# Patient Record
Sex: Male | Born: 1950 | Race: Black or African American | Hispanic: No | Marital: Single | Smoking: Current some day smoker
Health system: Southern US, Community
[De-identification: ages and names within clinical notes are randomized; demographics above are authoritative.]

## PROBLEM LIST (undated history)

## (undated) DIAGNOSIS — M5440 Lumbago with sciatica, unspecified side: Secondary | ICD-10-CM

## (undated) DIAGNOSIS — I10 Essential (primary) hypertension: Secondary | ICD-10-CM

## (undated) DIAGNOSIS — M545 Low back pain, unspecified: Secondary | ICD-10-CM

## (undated) DIAGNOSIS — E860 Dehydration: Secondary | ICD-10-CM

## (undated) DIAGNOSIS — Z8546 Personal history of malignant neoplasm of prostate: Secondary | ICD-10-CM

## (undated) DIAGNOSIS — N182 Chronic kidney disease, stage 2 (mild): Secondary | ICD-10-CM

## (undated) HISTORY — DX: Personal history of malignant neoplasm of prostate: Z85.46

## (undated) HISTORY — DX: Essential (primary) hypertension: I10

## (undated) HISTORY — DX: Dehydration: E86.0

## (undated) HISTORY — PX: PROSTATE SURGERY: SHX751

## (undated) HISTORY — DX: Low back pain, unspecified: M54.50

## (undated) HISTORY — PX: COLONOSCOPY: SHX174

## (undated) HISTORY — DX: Lumbago with sciatica, unspecified side: M54.40

## (undated) HISTORY — DX: Chronic kidney disease, stage 2 (mild): N18.2

---

## 2001-08-15 ENCOUNTER — Ambulatory Visit (HOSPITAL_COMMUNITY): Admission: RE | Admit: 2001-08-15 | Discharge: 2001-08-15 | Payer: Self-pay | Admitting: Internal Medicine

## 2003-08-06 ENCOUNTER — Encounter (INDEPENDENT_AMBULATORY_CARE_PROVIDER_SITE_OTHER): Payer: Self-pay | Admitting: Specialist

## 2003-08-07 ENCOUNTER — Inpatient Hospital Stay (HOSPITAL_COMMUNITY): Admission: RE | Admit: 2003-08-07 | Discharge: 2003-08-08 | Payer: Self-pay | Admitting: Urology

## 2004-09-26 ENCOUNTER — Ambulatory Visit (HOSPITAL_COMMUNITY): Admission: RE | Admit: 2004-09-26 | Discharge: 2004-09-26 | Payer: Self-pay | Admitting: Emergency Medicine

## 2005-04-27 ENCOUNTER — Encounter: Admission: RE | Admit: 2005-04-27 | Discharge: 2005-04-27 | Payer: Self-pay | Admitting: Emergency Medicine

## 2005-06-12 ENCOUNTER — Encounter: Admission: RE | Admit: 2005-06-12 | Discharge: 2005-06-12 | Payer: Self-pay | Admitting: Orthopaedic Surgery

## 2005-06-26 ENCOUNTER — Encounter: Admission: RE | Admit: 2005-06-26 | Discharge: 2005-06-26 | Payer: Self-pay | Admitting: Orthopaedic Surgery

## 2005-10-20 ENCOUNTER — Ambulatory Visit: Payer: Self-pay | Admitting: Internal Medicine

## 2005-10-22 ENCOUNTER — Ambulatory Visit: Payer: Self-pay | Admitting: Internal Medicine

## 2005-10-22 ENCOUNTER — Ambulatory Visit (HOSPITAL_COMMUNITY): Admission: RE | Admit: 2005-10-22 | Discharge: 2005-10-22 | Payer: Self-pay | Admitting: Internal Medicine

## 2005-11-04 ENCOUNTER — Ambulatory Visit: Payer: Self-pay

## 2006-07-13 ENCOUNTER — Emergency Department (HOSPITAL_COMMUNITY): Admission: EM | Admit: 2006-07-13 | Discharge: 2006-07-14 | Payer: Self-pay | Admitting: Emergency Medicine

## 2007-09-07 ENCOUNTER — Ambulatory Visit: Payer: Self-pay | Admitting: Internal Medicine

## 2007-09-08 ENCOUNTER — Ambulatory Visit: Payer: Self-pay | Admitting: *Deleted

## 2007-10-19 ENCOUNTER — Ambulatory Visit: Payer: Self-pay | Admitting: Internal Medicine

## 2007-10-19 ENCOUNTER — Encounter: Payer: Self-pay | Admitting: Family Medicine

## 2007-10-19 LAB — CONVERTED CEMR LAB
ALT: 13 units/L (ref 0–53)
AST: 16 units/L (ref 0–37)
Alkaline Phosphatase: 47 units/L (ref 39–117)
Basophils Absolute: 0 10*3/uL (ref 0.0–0.1)
Calcium: 10.4 mg/dL (ref 8.4–10.5)
Chloride: 102 meq/L (ref 96–112)
Cholesterol: 157 mg/dL (ref 0–200)
HCT: 44.8 % (ref 39.0–52.0)
HDL: 44 mg/dL (ref >39)
Hemoglobin: 15.1 g/dL (ref 13.0–17.0)
LDL Cholesterol: 95 mg/dL (ref 0–99)
Lymphs Abs: 2.4 10*3/uL (ref 0.7–4.0)
Neutro Abs: 2.3 10*3/uL (ref 1.7–7.7)
Platelets: 253 10*3/uL (ref 150–400)
Sed Rate: 9 mm/hr (ref 0–16)
Sodium: 142 meq/L (ref 135–145)
Total CHOL/HDL Ratio: 3.6
Triglycerides: 90 mg/dL (ref <150)
WBC: 5.9 10*3/uL (ref 4.0–10.5)

## 2009-03-08 ENCOUNTER — Ambulatory Visit: Payer: Self-pay | Admitting: Internal Medicine

## 2009-07-09 ENCOUNTER — Emergency Department (HOSPITAL_COMMUNITY): Admission: EM | Admit: 2009-07-09 | Discharge: 2009-07-09 | Payer: Self-pay | Admitting: Family Medicine

## 2009-09-13 ENCOUNTER — Ambulatory Visit: Payer: Self-pay | Admitting: Internal Medicine

## 2009-09-13 LAB — CONVERTED CEMR LAB: Microalb, Ur: 1.06 mg/dL (ref 0.00–1.89)

## 2010-05-06 ENCOUNTER — Encounter (INDEPENDENT_AMBULATORY_CARE_PROVIDER_SITE_OTHER): Payer: Self-pay | Admitting: Family Medicine

## 2010-05-06 LAB — CONVERTED CEMR LAB
ALT: 13 units/L (ref 0–53)
AST: 17 units/L (ref 0–37)
Calcium: 10.4 mg/dL (ref 8.4–10.5)
Chloride: 101 meq/L (ref 96–112)
Cholesterol: 161 mg/dL (ref 0–200)
Creatinine, Ser: 1.39 mg/dL (ref 0.40–1.50)
HDL: 32 mg/dL — ABNORMAL LOW (ref >39)
Potassium: 5.1 meq/L (ref 3.5–5.3)
Sodium: 139 meq/L (ref 135–145)
Total CHOL/HDL Ratio: 5
Total Protein: 8.4 g/dL — ABNORMAL HIGH (ref 6.0–8.3)

## 2010-05-11 ENCOUNTER — Encounter: Payer: Self-pay | Admitting: Emergency Medicine

## 2010-08-15 ENCOUNTER — Other Ambulatory Visit: Payer: Self-pay | Admitting: Family Medicine

## 2010-08-15 ENCOUNTER — Ambulatory Visit (HOSPITAL_COMMUNITY)
Admission: RE | Admit: 2010-08-15 | Discharge: 2010-08-15 | Disposition: A | Discharge status: Home / Self Care | Payer: Self-pay | Source: Ambulatory Visit | Attending: Family Medicine | Admitting: Family Medicine

## 2010-08-15 DIAGNOSIS — M51379 Other intervertebral disc degeneration, lumbosacral region without mention of lumbar back pain or lower extremity pain: Secondary | ICD-10-CM | POA: Insufficient documentation

## 2010-08-15 DIAGNOSIS — M545 Low back pain, unspecified: Secondary | ICD-10-CM | POA: Insufficient documentation

## 2010-08-15 DIAGNOSIS — M5030 Other cervical disc degeneration, unspecified cervical region: Secondary | ICD-10-CM | POA: Insufficient documentation

## 2010-08-15 DIAGNOSIS — M5137 Other intervertebral disc degeneration, lumbosacral region: Secondary | ICD-10-CM | POA: Insufficient documentation

## 2010-08-15 DIAGNOSIS — R52 Pain, unspecified: Secondary | ICD-10-CM

## 2010-09-05 NOTE — Discharge Summary (Signed)
[name], [name]                         ACCOUNT NO.:  [id number]   MEDICAL RECORD NO.:  [id number]                   PATIENT TYPE:  INP   LOCATION:  0375                                 FACILITY:  [facility]   PHYSICIAN:  Lindaann Slough, M.D.               DATE OF BIRTH:  11/06/1950   DATE OF ADMISSION:  08/06/2003  DATE OF DISCHARGE:  08/08/2003                                 DISCHARGE SUMMARY   DISCHARGE DIAGNOSES:  1. Benign prostatic hypertrophy.  2. Bladder outlet obstruction.   PROCEDURES DONE:  Cystoscopy and TURP on August 06, 2003.   HISTORY:  The patient is a 60 year old male who was admitted with complaints  of frequency, hesitancy, decreased force of urinary stream, nocturia x4 for  the past several months.  He had been having those symptoms in the past, and  was treated with Flomax, but he stopped taking the medication.  He was then  restarted on Flomax, and his symptoms had not improved.  He was then  switched to Uroxatral, and he continued to have the same symptoms.  Cystoscopy showed trilobar prostatic hypertrophy and trabeculated bladder  with cellules.  Renal ultrasound showed no hydronephrosis.  The patient was  admitted on August 06, 2003 for TURP.   PHYSICAL EXAMINATION:  VITAL SIGNS:  Blood pressure was 112/70, pulse 85,  respirations 18, temperature 98.6.  HEENT:  Head is normal.  Pupils are equal, round and reactive to light.  Ears, nose, and throat within normal limits.  NECK:  Supple.  No cervical lymph nodes.  No thyromegaly.  CHEST:  Asymmetrical.  LUNGS:  Fully expanded and clear to percussion and auscultation.  HEART:  Regular rhythm.  No murmur, no gallops.  ABDOMEN:  Soft, nondistended, nontender.  Liver, spleen, and kidney is not  palpable.  Bowel sounds normal.  GENITALIA:  Penis is uncircumcised.  The meatus is normal.  Scrotum is  normal.  Testicles, cords, and epididymides are within normal limits.  RECTAL:  Sphincter tone is normal.   Prostate is enlarged at 70 gm.  No  nodules.  Seminal vesicles not palpable.   LABORATORY DATA:  Hemoglobin on admission was 14.3, hematocrit 42.4, WBC  5.4.  Sodium 139, potassium 4.6, chloride 105, BUN 12, creatinine 1.2,  glucose 90.  Urinalysis is normal.   EKG showed normal sinus rhythm.  The patient had a TURP on August 06, 2003.  The postoperative course was uneventful.  He remained afebrile.  He was  started on a liquid diet on the first day postoperatively.  He tolerated his  diet well.  On the second day postoperatively, the Foley catheter was  removed, and after removing the Foley, he was voiding well.  His urine was  clear.  He was then discharged home on August 08, 2003.   DISCHARGE MEDICATIONS:  1. Hydrochlorothiazide 25 mg daily.  2. Bactrim DS one tablet twice a day.  DISCHARGE INSTRUCTIONS:  The patient is instructed not to do any lifting,  straining, driving, or engage in any sexual activities until further  advised.   DISCHARGE DIET:  Regular.   CONDITION ON DISCHARGE:  Improved.   FOLLOW UP:  The patient will be followed in my office in about 3 weeks.                                               Lindaann Slough, M.D.    MN/MEDQ  D:  08/08/2003  T:  08/08/2003  Job:  [id number]

## 2010-09-05 NOTE — Op Note (Signed)
Timothy [name], GASTER NO.:  [id number]   MEDICAL RECORD NO.:  [id number]          PATIENT TYPE:  OIB   LOCATION:  2899                         FACILITY:  [facility]   PHYSICIAN:  Duke Salvia, MD, FACCDATE OF BIRTH:  1951/03/21   DATE OF PROCEDURE:  DATE OF DISCHARGE:  10/22/2005                                 OPERATIVE REPORT   PROCEDURE:  Exploration of a previous implanted loop recorder.   DESCRIPTION OF PROCEDURE:  After obtaining informed consent, the patient was  brought to the electrophysiology laboratory and placed on the fluoroscopic  table.  After routine prep and drape, Lidocaine was infiltrated in line with  the previous incision.  The incision was open and carried down to the layer  of the device pocket.  Electrocautery and sharp dissection of the device was  explanted.  Anterior and posterior aspects of the pocket were apposed with a  Vicryl suture.  The pocket was then irrigated with antibiotic-containing  saline solution and the wound was closed in three layers in normal fashion.  The wound was washed and dried and benzo and Steri-Strip was then applied.  Needle and instrument counts were correct at the end of the procedure  according to staff.  The patient tolerated the procedure without apparent  complications.           ______________________________  Duke Salvia, MD, [facility]     SCK/MEDQ  D:  02/11/2006  T:  02/11/2006  Job:  [id number]

## 2010-09-05 NOTE — H&P (Signed)
NAME:  Timothy [name], Timothy [name]                         ACCOUNT NO.:  [id number]   MEDICAL RECORD NO.:  [id number]                   PATIENT TYPE:  OBV   LOCATION:  0375                                 FACILITY:  [facility]   PHYSICIAN:  Lindaann Slough, M.D.               DATE OF BIRTH:  1950-08-31   DATE OF ADMISSION:  08/06/2003  DATE OF DISCHARGE:                                HISTORY & PHYSICAL   CHIEF COMPLAINT:  Frequency, hesitancy, decreased force of urinary stream,  and nocturia times 4 to 5.   HISTORY OF PRESENT ILLNESS:  The patient is a 60 year old male who has been  complaining of frequency, hesitancy, decrease in force of urinary stream,  nocturia times 4-5 for the past several months.  He had been on Flomax for  these symptoms before, but he stopped taking that.  He was then restarted on  Flomax, and his symptoms have not abated.  He was then switched to Uroxatral  and continued to have the same symptoms.  Endoscopy showed a trilobar  hypertrophy and chronic trabeculectomy with cellules.  Renal ultrasound  showed no hydronephrosis.  The patient had continued to complain of these  symptoms.  Other options were discussed with him.  TUNA procedure, TURP.  He  states that he would like to proceed with TURP, so he is admitted today for  the procedure.   PAST MEDICAL HISTORY:  History of hypertension.   MEDICATIONS:  Hydrochlorothiazide 25 mg daily.   ALLERGIES:  No known drug allergies.   PAST SURGICAL HISTORY:  Loop recorder implantation in 2001 for syncope.   SOCIAL HISTORY:  He is married, has eight children.  He smokes cigars, does  not drink.   FAMILY HISTORY:  He does not know anything about his father.  His mother is  69 years old and is doing well.  There is a family history of hypertension,  and he has four sisters and two brothers.   REVIEW OF SYSTEMS:  He has no cough, no shortness of breath, no chest pain,  he has tachycardia.  He has no nausea, no vomiting, no  diarrhea or  constipation.  GENITOURINARY:  He complains of frequency, urgency,  hesitancy, decreased in force of urinary stream, and nocturia x4.   PHYSICAL EXAMINATION:  GENERAL:  This is a well-developed 60 year old male  in no acute distress.  VITAL SIGNS:  His blood pressure is 112/70, pulse 85, respirations 18,  temperature 98.6.  HEENT:  His head is normal.  Pupils equal, round, reactive to light and  accommodation.  Ears, nose, and throat are within normal limits.  NECK:  Supple, no cervical lymph nodes, no thyromegaly.  CHEST:  Symmetrical.  LUNGS:  Fully expanded and clear to auscultation and percussion.  HEART:  Regular rhythm, no murmurs, no gallops.  ABDOMEN:  Soft, nontender, nondistended.  Liver, spleen and kidneys not  palpable.  Bowel sounds normal.  GENITOURINARY:  Penis is uncircumcised and meatus is normal.  Scrotum is  unremarkable.  He has no hydrocele.  Testicles, cords, and epididymis are  within normal limits.  EXTREMITIES:  Within normal limits.  He has no pedal edema.  No deformities,  and good peripheral pulses.  RECTAL:  Sphincter tone is normal.  Prostate is enlarged, 40 g, no nodules,  seminal vesicles not palpable.   ADMISSION DIAGNOSES:  1. Bladder outlet obstruction.  2. Benign prostatic hypertrophy.                                               Lindaann Slough, M.D.    MN/MEDQ  D:  08/06/2003  T:  08/06/2003  Job:  [id number]

## 2010-09-05 NOTE — Op Note (Signed)
NAMEROLANDO, HESSLING NO.:  [id number]   MEDICAL RECORD NO.:  [id number]          PATIENT TYPE:  OIB   LOCATION:  2899                         FACILITY:  [facility]   PHYSICIAN:  Duke Salvia, MD, FACCDATE OF BIRTH:  10-31-1950   DATE OF PROCEDURE:  10/22/2005  DATE OF DISCHARGE:  10/22/2005                                 OPERATIVE REPORT   SURGEON:  Duke Salvia, MD, [facility].   PROCEDURE:  Explantation of a loop recorder.   PREOPERATIVE DIAGNOSIS:  Syncope, with previously implanted loop recorder.   POSTOPERATIVE DIAGNOSIS:  Syncope, with previously implanted loop recorder.   DESCRIPTION OF PROCEDURE:  Following obtaining informed consent, the patient  was brought to the electrophysiology laboratory and placed on the  fluoroscopic table in the supine position.  After routine prep and drape,  lidocaine was infiltrated along the line of the previous incision, and  carried down to the layer of the device pocket using electrocautery and  sharp dissection.  The pocket was opened.  The device was explanted with the  removal of the anchoring sutures.  The pocket was copiously irrigated with  antibiotic-containing saline solution.  Hemostasis was obtained.  The  anterior and posterior aspects of the pocket were opposed with a 2-0 Vicryl  suture, and the wound was then closed in 2  layers in the normal fashion.  The wound was washed and dried and a benzoin and Steri-Strips dressing was  applied.  Needle counts, sponge counts and instrument counts were correct at  the end of the procedure according to the staff.  The patient tolerated the  procedure without apparent complication.           ______________________________  Duke Salvia, MD, [facility]     SCK/MEDQ  D:  12/26/2005  T:  12/26/2005  Job:  [id number]

## 2010-09-05 NOTE — Op Note (Signed)
NAME:  Timothy [name], Timothy [name]                         ACCOUNT NO.:  [id number]   MEDICAL RECORD NO.:  [id number]                   PATIENT TYPE:  OBV   LOCATION:  0375                                 [facility]ACILITY[facility]   PHYSICIAN:  Lindaann Slough, M.D.               DATE OF BIRTH:  August 02, 1950   DATE OF PROCEDURE:  08/06/2003  DATE OF DISCHARGE:                                 OPERATIVE REPORT   PREOPERATIVE DIAGNOSIS:  Benign prostatic hypertrophy and bladder outlet  obstruction.   POSTOPERATIVE DIAGNOSIS:  Benign prostatic hypertrophy and bladder outlet  obstruction.   OPERATION/PROCEDURE:  Transurethral resection of the prostate.   SURGEON:  Lindaann Slough, M.D.   ANESTHESIA:  General.   INDICATIONS:  The patient is a 60 year old male who has been complaining of  frequency, hesitancy, decreased force of the urinary stream and nocturia x4-  5 for several months.  He had been treated with Flomax for the symptoms  before.  However, he had to stop taking the medication.  He was then  restarted on Flomax and he did not have any improvement of the symptoms.  He  was then switched to UroXatral without any improvement.  Cystoscopy showed a  trilobar prostatic hypertrophy and trabeculated bladder.  Renal ultrasound  showed no hydronephrosis.  Treatment options were discussed and we found the  patient TUNA versus TURP and he chose to have a TURP.  The risks and  benefits of the procedure were discussed with him.  The risks include but  are not limited to hemorrhage, infection, incontinence, and retrograde  ejaculation.  He understands and is agreeable.  He is scheduled today for  the procedure.   DESCRIPTION OF PROCEDURE:  Under general anesthesia, the patient was prepped  and draped and placed in the dorsal lithotomy position.  A #22 Wappler  cystoscope was inserted in the bladder.  The patient had trilobar prostatic  hypertrophy with a large median lobe.  The bladder is trabeculated  with  cellules.  The ureteral orifices are in normal position and shape and clear  efflux.  The cystoscope was then removed.  The urethra was then dilated with  #30-French.  Then a #28 continuous-flow Iglesias resectoscope was inserted  in the bladder.  The median lobe was then resected first.  Then resection of  the prostate gland was done between the 7 and 10 o'clock position, between  the 2 and 5 o'clock position using the bladder neck and verumontanum has  landmarks.  Then the resection was completed between 10 and 2 o'clock  position, and between the 5 and 7 o'clock position using the same landmarks.  Hemostasis was secured with electrocautery.  The prostatic chips were then  irrigated out of the bladder.  There was minimal  bleeding at the end of the procedure.  The resectoscope was then removed.  Then a #24, three-way Foley catheter was inserted in the bladder.  The patient tolerated the procedure well and left the OR in satisfactory  condition to post anesthesia care unit.                                               Lindaann Slough, M.D.    MN/MEDQ  D:  08/06/2003  T:  08/06/2003  Job:  [id number]   cc:   Reuben Likes, M.D.  [address]  [place]  [place] [address]  Fax: [phone]   Duke Salvia, M.D.

## 2010-09-05 NOTE — Procedures (Signed)
[place]  Patient:    Timothy [name], Timothy [name] Visit Number: [id number] MRN: [id number]          Service Type: CAT Location: [facility] 2899 13 Attending Physician:  Nathen May Dictated by:   Nathen May, M.D., [facility] Proc. Date: 08/15/01 Admit Date:  08/15/2001 Discharge Date: 08/15/2001   CC:         Reuben Likes, M.D.  Electrophysiology Laboratory  [place]   Procedure Report  PREOPERATIVE DIAGNOSIS:  Recurrent syncope.  POSTOPERATIVE DIAGNOSIS:  Recurrent syncope.  PROCEDURE:  Head-up tilt table testing with isoproterenol infusion.  DESCRIPTION OF PROCEDURE:  Following the equilibrations and in the supine position, the patient was tilted upright at 70 degrees.  There was no significant change in heart rate and/or blood pressure.  He was returned to the supine position.  Isoproterenol was then started at 1 mcg/min. and increased gradually to 2 mcg/min. to accomplish an incremental heart rate of 25%.  The patient was then tilted upright.  Again there was no significant change in heart rate or blood pressure.  IMPRESSION:  Negative head-up tilt table test.  RECOMMENDATION:  Implantable loop recorder. Dictated by:   Nathen May, M.D., [facility] Attending Physician:  Nathen May DD:  08/15/01 TD:  08/15/01 Job: [id number]

## 2010-09-05 NOTE — Procedures (Signed)
[name]  Patient:    Timothy [name], Timothy [name] Visit Number: [id number] MRN: [id number]          Service Type: CAT Location: [facility] 2899 13 Attending Physician:  Nathen May Dictated by:   Nathen May, M.D., [facility] Proc. Date: 08/15/01 Admit Date:  08/15/2001 Discharge Date: 08/15/2001   CC:         Reuben Likes, M.D.  Electrophysiology Laboratory  [place]   Procedure Report  PREOPERATIVE DIAGNOSIS:  Recurrent syncope with negative tilt table test.  POSTOPERATIVE DIAGNOSIS:  Recurrent syncope with negative tilt table test.  PROCEDURE:  Implantation of a loop recorder.  DESCRIPTION OF PROCEDURE:  Following the obtaining of informed consent, the patient was then prepared for implantable loop recorder insertion.  After routine prep and drape of the left upper chest, lidocaine was infiltrated approximately 2 cm below the clavicle and 2-3 cm lateral to the sternum just lateral to a couple of bony prominences.  Incision was carried down to the layer of the prepectoral fascia and a pocket was formed. Hemostasis was obtained.  A Reveal Plus [id number] implantable monitor was inserted, serial number [id number]. It was fixed with a single incision, looped through the lateral and the medial aspects of the recorder and tied laterally below the device.  Hemostasis was again assured, and the wound was then closed in three layers in the normal fashion.  The wound was washed, dried, and a benzoin and Steri-Strip dressing was then applied.  Needle counts, sponge counts, and instrument counts were correct at the end of the procedure according to the staff.  The patient tolerated the procedure well. Dictated by:   Nathen May, M.D., [facility] Attending Physician:  Nathen May DD:  08/15/01 TD:  08/15/01 Job: [id number]

## 2013-11-20 ENCOUNTER — Encounter: Payer: Self-pay | Admitting: Internal Medicine

## 2014-06-26 ENCOUNTER — Emergency Department (HOSPITAL_COMMUNITY)
Admission: EM | Admit: 2014-06-26 | Discharge: 2014-06-26 | Disposition: A | Discharge status: Home / Self Care | Payer: No Typology Code available for payment source | Attending: Emergency Medicine | Admitting: Emergency Medicine

## 2014-06-26 ENCOUNTER — Encounter (HOSPITAL_COMMUNITY): Payer: Self-pay | Admitting: Emergency Medicine

## 2014-06-26 DIAGNOSIS — Y92410 Unspecified street and highway as the place of occurrence of the external cause: Secondary | ICD-10-CM | POA: Insufficient documentation

## 2014-06-26 DIAGNOSIS — Z720 Tobacco use: Secondary | ICD-10-CM | POA: Diagnosis not present

## 2014-06-26 DIAGNOSIS — Y998 Other external cause status: Secondary | ICD-10-CM | POA: Diagnosis not present

## 2014-06-26 DIAGNOSIS — M545 Low back pain, unspecified: Secondary | ICD-10-CM

## 2014-06-26 DIAGNOSIS — S3992XA Unspecified injury of lower back, initial encounter: Secondary | ICD-10-CM | POA: Insufficient documentation

## 2014-06-26 DIAGNOSIS — S199XXA Unspecified injury of neck, initial encounter: Secondary | ICD-10-CM | POA: Diagnosis not present

## 2014-06-26 DIAGNOSIS — Y9389 Activity, other specified: Secondary | ICD-10-CM | POA: Diagnosis not present

## 2014-06-26 DIAGNOSIS — S0990XA Unspecified injury of head, initial encounter: Secondary | ICD-10-CM | POA: Diagnosis not present

## 2014-06-26 DIAGNOSIS — M542 Cervicalgia: Secondary | ICD-10-CM

## 2014-06-26 MED ORDER — HYDROCODONE-ACETAMINOPHEN 5-325 MG PO TABS: 2.0000 | ORAL_TABLET | ORAL | Status: DC | PRN
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
## 2014-06-26 NOTE — Discharge Instructions (Signed)

## 2017-04-26 ENCOUNTER — Ambulatory Visit (HOSPITAL_COMMUNITY)
Admission: EM | Admit: 2017-04-26 | Discharge: 2017-04-26 | Disposition: A | Discharge status: Home / Self Care | Payer: Medicare HMO | Attending: Family Medicine | Admitting: Family Medicine

## 2017-04-26 ENCOUNTER — Encounter (HOSPITAL_COMMUNITY): Payer: Self-pay | Admitting: Emergency Medicine

## 2017-04-26 DIAGNOSIS — M5441 Lumbago with sciatica, right side: Secondary | ICD-10-CM | POA: Diagnosis not present

## 2017-04-26 DIAGNOSIS — G8929 Other chronic pain: Secondary | ICD-10-CM | POA: Diagnosis not present

## 2017-04-26 MED ORDER — PREDNISONE 10 MG (21) PO TBPK: ORAL_TABLET | ORAL | 0 refills | Status: AC

## 2017-04-26 MED ORDER — KETOROLAC TROMETHAMINE 30 MG/ML IJ SOLN
INTRAMUSCULAR | Status: AC
  Filled 2017-04-26: qty 1

## 2017-04-26 MED ORDER — MELOXICAM 7.5 MG PO TABS: 7.5000 mg | ORAL_TABLET | Freq: Every day | ORAL | 0 refills | Status: DC

## 2017-04-26 MED ORDER — METHYLPREDNISOLONE SODIUM SUCC 125 MG IJ SOLR
80.0000 mg | Freq: Once | INTRAMUSCULAR | Status: AC
  Administered 2017-04-26: 80 mg via INTRAMUSCULAR

## 2017-04-26 MED ORDER — KETOROLAC TROMETHAMINE 30 MG/ML IJ SOLN
30.0000 mg | Freq: Once | INTRAMUSCULAR | Status: AC
Start: 2017-04-26 — End: 2017-04-26
  Administered 2017-04-26: 30 mg via INTRAMUSCULAR

## 2017-04-26 MED ORDER — METHYLPREDNISOLONE SODIUM SUCC 125 MG IJ SOLR
INTRAMUSCULAR | Status: AC
  Filled 2017-04-26: qty 2

## 2017-04-26 NOTE — ED Provider Notes (Signed)
[facility]    CSN: [id number] Arrival date & time: 04/26/17  1424     History   Chief Complaint Chief Complaint  Patient presents with  . Back Pain  . Leg Pain    HPI Timothy [name] is a 67 y.o. male.   67 year old male with no significant past medical history comes in for 1 week history of acute on chronic back pain.  Patient states he has been treating his chronic back pain with stretches and movement, which has not helped with his current episode of back pain.  He states in the past he had x-rays that showed degenerative disc disease.  He denies saddle anesthesia, loss of bladder or bowel control.  He has not taken anything for the pain.  Denies urinary symptoms such as frequency, dysuria, hematuria.  States that low back pain radiates down the right leg if he bends his back.      History reviewed. No pertinent past medical history.  There are no active problems to display for this patient.   Past Surgical History:  Procedure Laterality Date  . PROSTATE SURGERY         Home Medications    Prior to Admission medications   Medication Sig Start Date End Date Taking? Authorizing Provider  meloxicam (MOBIC) 7.5 MG tablet Take 1 tablet (7.5 mg total) by mouth daily. 04/26/17   Tasia Catchings, Naina Sleeper V, PA-C  predniSONE (STERAPRED UNI-PAK 21 TAB) 10 MG (21) TBPK tablet Take 6 tablets (60 mg total) by mouth daily for 2 days, THEN 5 tablets (50 mg total) daily for 2 days, THEN 4 tablets (40 mg total) daily for 2 days, THEN 3 tablets (30 mg total) daily for 2 days, THEN 2 tablets (20 mg total) daily for 2 days, THEN 1 tablet (10 mg total) daily for 2 days. 04/26/17 05/08/17  Ok Edwards, PA-C    Family History No family history on file.  Social History Social History   Tobacco Use  . Smoking status: Current Some Day Smoker    Types: Cigars  . Smokeless tobacco: Never Used  Substance Use Topics  . Alcohol use: Yes  . Drug use: Yes    Types: Marijuana     Allergies     Patient has no known allergies.   Review of Systems Review of Systems  Reason unable to perform ROS: See HPI as above.     Physical Exam Triage Vital Signs ED Triage Vitals  Enc Vitals Group     BP 04/26/17 1440 (!) 159/95     Pulse Rate 04/26/17 1440 88     Resp 04/26/17 1440 16     Temp 04/26/17 1440 98.3 F (36.8 C)     Temp Source 04/26/17 1440 Oral     SpO2 04/26/17 1440 97 %     Weight 04/26/17 1441 165 lb (74.8 kg)     Height 04/26/17 1441 5\' 6"  (1.676 m)     Head Circumference --      Peak Flow --      Pain Score 04/26/17 1441 9     Pain Loc --      Pain Edu? --      Excl. in [place]? --    No data found.  Updated Vital Signs BP (!) 159/95   Pulse 88   Temp 98.3 F (36.8 C) (Oral)   Resp 16   Ht 5\' 6"  (1.676 m)   Wt 165 lb (74.8 kg)   SpO2 97%  BMI 26.63 kg/m   Physical Exam  Constitutional: He is oriented to person, place, and time. He appears well-developed and well-nourished. No distress.  HENT:  Head: Normocephalic and atraumatic.  Eyes: Conjunctivae are normal. Pupils are equal, round, and reactive to light.  Cardiovascular: Normal rate, regular rhythm and normal heart sounds. Exam reveals no gallop and no friction rub.  No murmur heard. Pulmonary/Chest: Effort normal and breath sounds normal. He has no wheezes. He has no rales.  Musculoskeletal:  No tenderness on palpation of the spinous processes. Tenderness on palpation of the right lumbar region. Decreased ROM of back with bending, stating due to pain. Unable to do ROM of hip as exacerbates right low back pain. Positive straight leg raise.   Neurological: He is alert and oriented to person, place, and time.  Skin: Skin is warm and dry.     UC Treatments / Results  Labs (all labs ordered are listed, but only abnormal results are displayed) Labs Reviewed - No data to display  EKG  EKG Interpretation None       Radiology No results found.  Procedures Procedures (including critical  care time)  Medications Ordered in UC Medications  ketorolac (TORADOL) 30 MG/ML injection 30 mg (30 mg Intramuscular Given 04/26/17 1547)  methylPREDNISolone sodium succinate (SOLU-MEDROL) 125 mg/2 mL injection 80 mg (80 mg Intramuscular Given 04/26/17 1549)     Initial Impression / Assessment and Plan / UC Course  I have reviewed the triage vital signs and the nursing notes.  Pertinent labs & imaging results that were available during my care of the patient were reviewed by me and considered in my medical decision making (see chart for details).    Toradol and Solu-Medrol in office.  Will start patient on Mobic and prednisone Dosepak.  Patient to follow-up with orthopedics for further evaluation and management needed.  Return precautions given.  Patient expresses understanding and agrees to plan.  Final Clinical Impressions(s) / UC Diagnoses   Final diagnoses:  Chronic right-sided low back pain with right-sided sciatica    ED Discharge Orders        Ordered    meloxicam (MOBIC) 7.5 MG tablet  Daily     04/26/17 1538    predniSONE (STERAPRED UNI-PAK 21 TAB) 10 MG (21) TBPK tablet     04/26/17 1538        Estoria Geary V, PA-C 04/26/17 1656

## 2017-04-26 NOTE — Discharge Instructions (Signed)
Toradol and Solu-Medrol injection in office for back pain.  Start Mobic as directed.  Start prednisone Dosepak as directed.  As discussed, may need further evaluation by orthopedics, I have attached orthopedics information for you, please follow-up.  Please go to the emergency department if experiencing numbness and tingling of the inner thighs, loss of bladder or bowel control.

## 2017-04-26 NOTE — ED Triage Notes (Signed)
PT reports chronic back pain. PT now has pain shooting down right leg for 1 week.

## 2017-06-04 ENCOUNTER — Encounter (HOSPITAL_COMMUNITY): Payer: Self-pay

## 2017-06-04 ENCOUNTER — Emergency Department (HOSPITAL_COMMUNITY)
Admission: EM | Admit: 2017-06-04 | Discharge: 2017-06-04 | Disposition: A | Discharge status: Home / Self Care | Payer: Medicare HMO | Attending: Emergency Medicine | Admitting: Emergency Medicine

## 2017-06-04 ENCOUNTER — Other Ambulatory Visit: Payer: Self-pay

## 2017-06-04 DIAGNOSIS — R531 Weakness: Secondary | ICD-10-CM | POA: Diagnosis not present

## 2017-06-04 DIAGNOSIS — M5441 Lumbago with sciatica, right side: Secondary | ICD-10-CM | POA: Diagnosis not present

## 2017-06-04 DIAGNOSIS — F17290 Nicotine dependence, other tobacco product, uncomplicated: Secondary | ICD-10-CM | POA: Insufficient documentation

## 2017-06-04 DIAGNOSIS — R69 Illness, unspecified: Secondary | ICD-10-CM | POA: Diagnosis not present

## 2017-06-04 DIAGNOSIS — R202 Paresthesia of skin: Secondary | ICD-10-CM | POA: Diagnosis not present

## 2017-06-04 DIAGNOSIS — F1210 Cannabis abuse, uncomplicated: Secondary | ICD-10-CM | POA: Diagnosis not present

## 2017-06-04 DIAGNOSIS — M545 Low back pain: Secondary | ICD-10-CM | POA: Diagnosis present

## 2017-06-04 MED ORDER — HYDROCODONE-ACETAMINOPHEN 5-325 MG PO TABS: 1.0000 | ORAL_TABLET | Freq: Four times a day (QID) | ORAL | 0 refills | Status: DC | PRN

## 2017-06-04 MED ORDER — GABAPENTIN 100 MG PO CAPS
100.0000 mg | ORAL_CAPSULE | Freq: Once | ORAL | Status: AC
  Administered 2017-06-04: 100 mg via ORAL
  Filled 2017-06-04: qty 1

## 2017-06-04 MED ORDER — GABAPENTIN 100 MG PO CAPS: 100.0000 mg | ORAL_CAPSULE | Freq: Three times a day (TID) | ORAL | 0 refills | Status: DC

## 2017-06-04 MED ORDER — HYDROCODONE-ACETAMINOPHEN 5-325 MG PO TABS
1.0000 | ORAL_TABLET | Freq: Once | ORAL | Status: AC
  Administered 2017-06-04: 1 via ORAL
  Filled 2017-06-04: qty 1

## 2017-06-04 NOTE — Discharge Instructions (Addendum)
Your symptoms are caused by irritation and inflammation of the sciatic nerve, please take gabapentin 3 times daily, Norco as needed for pain, this medication can make you drowsy please only use if needed. Please call to schedule follow up appointment with [facility]. It is also extremely important you establish a primary care doctor, please use the phone number provided on your paperwork, you will need to have a blood pressure recheck and will likely be started on BP meds. If you develop severely worsened back pain, los of control of your bowel or bladder, are unable to walk or have weakness or numbness in both legs return to ED.

## 2017-06-04 NOTE — ED Triage Notes (Signed)
Pt presents to the ed with complaints of lower back pain that radiates down his right leg, denies problems using the restroom or weakness anywhere. Pt was seen at Urgent care and told to follow up with ortho. Per the patient ortho has not received paperwork from urgent care so they cannot see him yet.  Pt is ambulatory.  Hypertensive in triage, reports being out of BP medication.

## 2017-06-04 NOTE — ED Notes (Signed)
See EDP secondary assessment.  

## 2017-06-04 NOTE — ED Notes (Signed)
Unable to obtain signature for discharge d/t other individual still in pt's chart. Reviewed instructions and pt expressed understanding with no additional questions. Pt departed in NAD, refused use of wheelchair.

## 2017-06-04 NOTE — ED Provider Notes (Signed)
[facility] EMERGENCY DEPARTMENT Provider Note   CSN: [id number] Arrival date & time: 06/04/17  1552     History   Chief Complaint Chief Complaint  Patient presents with  . Back Pain    HPI  Timothy [name] is a 67 y.o. Male with a history of hypertension for which he currently does not take any medication, who presents to the ED for evaluation of low back pain that radiates down the right leg.  Reports back pain started a little over a month ago, he was initially seen at [facility] urgent care, where he was treated with Mobic and steroids, and told to follow-up with [facility], he is called multiple times, but has been unable to get an appointment with them, they have told him that they had not received paperwork from urgent care so they are unable to see him.  Reports pain has been constant and not improving despite medications, he completed course of both. Pt reports some intermittent tingling down the right leg, with some mild weakness, he reports he knee gives out some and caused him to fall to his knee, did not hit his head, no other injuries from fall. Denies weakness, numbness, loss of bowel/bladder function or saddle anesthesia. No fevers or chills, abdominal pain or dysuria. No hx of cancer or IV drug use. Pt was noted to be hypertensive in triage to 160s, reports he was diagnosed with hypertension, but has not taken meds for it in years, reports he does not like to take medications and does not have a PCP.       History reviewed. No pertinent past medical history.  There are no active problems to display for this patient.   Past Surgical History:  Procedure Laterality Date  . PROSTATE SURGERY         Home Medications    Prior to Admission medications   Medication Sig Start Date End Date Taking? Authorizing Provider  gabapentin (NEURONTIN) 100 MG capsule Take 1 capsule (100 mg total) by mouth 3 (three) times daily. 06/04/17   Jacqlyn Larsen, PA-C   HYDROcodone-acetaminophen (NORCO/VICODIN) 5-325 MG tablet Take 1 tablet by mouth every 6 (six) hours as needed. 06/04/17   Jacqlyn Larsen, PA-C    Family History No family history on file.  Social History Social History   Tobacco Use  . Smoking status: Current Some Day Smoker    Types: Cigars  . Smokeless tobacco: Never Used  Substance Use Topics  . Alcohol use: Yes  . Drug use: Yes    Types: Marijuana     Allergies   Patient has no known allergies.   Review of Systems Review of Systems  Constitutional: Negative for chills and fever.  HENT: Negative.   Eyes: Negative for visual disturbance.  Respiratory: Negative for shortness of breath.   Cardiovascular: Negative for chest pain.  Gastrointestinal: Negative for abdominal pain, blood in stool, constipation, diarrhea, nausea and vomiting.  Genitourinary: Negative for difficulty urinating, dysuria and frequency.  Musculoskeletal: Positive for back pain. Negative for arthralgias, gait problem, myalgias and neck pain.  Skin: Negative for color change, rash and wound.  Neurological: Positive for weakness. Negative for dizziness, light-headedness and numbness.     Physical Exam Updated Vital Signs BP (!) 161/99 (BP Location: Right Arm)   Pulse 81   Temp 98.9 F (37.2 C)   Resp 18   Ht 5\' 6"  (1.676 m)   Wt 78.9 kg (174 lb)   SpO2 97%  BMI 28.08 kg/m   Physical Exam  Constitutional: He appears well-developed and well-nourished. No distress.  HENT:  Head: Normocephalic and atraumatic.  Eyes: Right eye exhibits no discharge. Left eye exhibits no discharge.  Neck: Neck supple.  Cardiovascular: Normal rate, regular rhythm, normal heart sounds and intact distal pulses.  Pulses:      Radial pulses are 2+ on the right side, and 2+ on the left side.       Dorsalis pedis pulses are 2+ on the right side, and 2+ on the left side.  Pulmonary/Chest: Effort normal and breath sounds normal. No stridor. No respiratory distress.  He has no wheezes. He has no rales.  Respirations equal and unlabored, patient able to speak in full sentences, lungs clear to auscultation bilaterally  Abdominal: Soft. Bowel sounds are normal. He exhibits no distension and no mass. There is no tenderness. There is no guarding.  Musculoskeletal:  Lumbar spine with tenderness at midline and extending across the right lower back, no erythema, warmth or rash, no palpable deformity Pain increased with range of motion of the lower extremities  Neurological: He is alert. Coordination normal.  Speech is clear, able to follow commands CN III-XII intact Normal strength in upper extremities bilaterally, right lower extremity exhibits 4/5 strength of the proximal muscles, 5/5 on the left, dorsi and plantar flexion 5/5 bilaterally Bilateral patellar and Achilles DTRs 2+ Sensation normal to light and sharp touch Moves extremities without ataxia, coordination intact  Skin: Skin is warm and dry. Capillary refill takes less than 2 seconds. He is not diaphoretic.  Psychiatric: He has a normal mood and affect. His behavior is normal.  Nursing note and vitals reviewed.    ED Treatments / Results  Labs (all labs ordered are listed, but only abnormal results are displayed) Labs Reviewed - No data to display  EKG  EKG Interpretation None       Radiology No results found.  Procedures Procedures (including critical care time)  Medications Ordered in ED Medications  HYDROcodone-acetaminophen (NORCO/VICODIN) 5-325 MG per tablet 1 tablet (1 tablet Oral Given 06/04/17 1856)  gabapentin (NEURONTIN) capsule 100 mg (100 mg Oral Given 06/04/17 1856)     Initial Impression / Assessment and Plan / ED Course  I have reviewed the triage vital signs and the nursing notes.  Pertinent labs & imaging results that were available during my care of the patient were reviewed by me and considered in my medical decision making (see chart for details).  Presents to  the ED for evaluation of a month of low back pain that radiates down the right leg, was initially seen in urgent care, pain has not improved with NSAIDs and steroids, and patient has been unable to follow-up with orthopedics.  Patient reports some intermittent weakness of the right leg as well as tingling.  Patient denies any fevers or chills, abdominal pain, urinary symptoms, has been ambulatory with some discomfort, has not had any weakness in bilateral legs, saddle anesthesia, or loss of bowel or bladder control to suggest cauda equina syndrome.  There are no neurologic deficits on exam, aside from slight weakness of the right lower extremity 4/5 when compared to 5/5 on the left.  Patient is ambulating in the department without difficulty.  Patient will likely need an MRI in the near future, there is no indication for emergent MRI at this time.  Patient was also noted to be hypertensive in the department, was diagnosed with hypertension several years ago but has not  taken medication for this in years and it does not see her primary care provider.  Stressed the importance of having a primary care provider in treating his high blood pressure, and discussed the long-term consequences of hypertension, patient will use the phone number provided to establish primary care.  Given hypertension and patient's age do not feel that NSAIDs would be appropriate treatment for his back pain.  Will try patient on gabapentin, also provided short course of Norco until patient is able to follow-up with primary care, patient was also referred to neurosurgery for evaluation of sciatica symptoms.  Patient expresses understanding and is in agreement with this plan.  No further questions at discharge and in no acute distress.  Final Clinical Impressions(s) / ED Diagnoses   Final diagnoses:  Right-sided low back pain with right-sided sciatica, unspecified chronicity    ED Discharge Orders        Ordered    gabapentin (NEURONTIN)  100 MG capsule  3 times daily     06/04/17 1830    HYDROcodone-acetaminophen (NORCO/VICODIN) 5-325 MG tablet  Every 6 hours PRN     06/04/17 1830       Jacqlyn Larsen, PA-C 06/05/17 0256    Blanchie Dessert, MD 06/05/17 1609

## 2017-06-25 ENCOUNTER — Ambulatory Visit (INDEPENDENT_AMBULATORY_CARE_PROVIDER_SITE_OTHER): Payer: PRIVATE HEALTH INSURANCE | Admitting: Family Medicine

## 2017-06-25 ENCOUNTER — Other Ambulatory Visit: Payer: Self-pay

## 2017-06-25 ENCOUNTER — Encounter: Payer: Self-pay | Admitting: Family Medicine

## 2017-06-25 DIAGNOSIS — Z8679 Personal history of other diseases of the circulatory system: Secondary | ICD-10-CM | POA: Diagnosis not present

## 2017-06-25 DIAGNOSIS — Z23 Encounter for immunization: Secondary | ICD-10-CM

## 2017-06-25 DIAGNOSIS — Z8546 Personal history of malignant neoplasm of prostate: Secondary | ICD-10-CM | POA: Diagnosis not present

## 2017-06-25 MED ORDER — MELOXICAM 7.5 MG PO TABS: 7.5000 mg | ORAL_TABLET | Freq: Every day | ORAL | 0 refills | Status: DC

## 2017-06-25 MED ORDER — GABAPENTIN 100 MG PO CAPS: 100.0000 mg | ORAL_CAPSULE | Freq: Three times a day (TID) | ORAL | 0 refills | Status: DC

## 2017-06-25 NOTE — Patient Instructions (Signed)
It was nice meeting you today. You were seen in clinic to establish care with me as your new PCP.  We reviewed your medical history, social and family history as well as your medications.  You received your flu shot today.  I would like for you to return in 1 week to discuss your back pain.  In the meantime, I have refilled your Mobic and Gabapentin and you can pick this up from your pharmacy today.  Please call clinic if you have any questions.  Be well, Lovenia Kim MD

## 2017-06-25 NOTE — Progress Notes (Signed)
   Subjective:   Patient ID: Timothy [name]    DOB: 11-14-1950, 67 y.o. male   MRN: [id number]  CC: establish care with PCP  HPI: [name] is a 67 y.o. male who presents to clinic today to establish care with new PCP.  Patient feels well today.  He is from [place].  He states he has not seen a doctor in a long time.  He lives at home by himself.  He has 4 children.  He is retired and used to work in an International aid/development worker.     PMH: HTN (controlled off medication), h/o prostate cancer, h/o back pain 10 years  FHx: no known, father with cancer ?unsure what type  Surg Hx: prostate cancer surgery  Social: former cigarette smoker, quit 2016, current cigar smoker, social alcohol use, daily THC use   Medications: meloxicam 7.5 mg daily, gabapentin 100 mg TID  Allergies: no known  Health Maintenance -Flu shot given today -address colonoscopy at next visit    ROS: No fever, chills, nausea, vomiting.  No shortness of breath, chest pain, palpitations.  Medications reviewed. Objective:   BP 120/90   Pulse 82   Temp 98.5 F (36.9 C) (Oral)   Ht 5\' 7"  (1.702 m)   Wt 142 lb (64.4 kg)   SpO2 98%   BMI 22.24 kg/m  Vitals and nursing note reviewed.  General: 67 year old male, NAD HEENT: NCAT, EOMI, PERRLA, MMM, oropharynx is clear Neck: supple, no LAD CV: RRR no MRG Lungs: CTA B, normal effort Abdomen: soft, NT ND, +bs Skin: warm, dry, no rash Extremities: warm and well perfused, normal tone Psych: Normal mood and affect  Assessment & Plan:   67 year old male presents to establish care with new PCP.  No concerns at today's visit.  Physical exam unremarkable.  Medical history and medications reviewed.  Gabapentin and meloxicam refilled for chronic back pain.   Following health maintenance issues were discussed and patient is to return in 1-2 weeks to address back pain.  Health Maintenance -Flu shot given today -address colonoscopy at next visit    Orders Placed This Encounter    Procedures  . Flu Vaccine QUAD 36+ mos IM   Meds ordered this encounter  Medications  . meloxicam (MOBIC) 7.5 MG tablet    Sig: Take 1 tablet (7.5 mg total) by mouth daily.    Dispense:  60 tablet    Refill:  0  . gabapentin (NEURONTIN) 100 MG capsule    Sig: Take 1 capsule (100 mg total) by mouth 3 (three) times daily.    Dispense:  60 capsule    Refill:  0   Follow-up: 1 week  Lovenia Kim, MD [place], PGY-2 07/07/2017 5:23 PM

## 2017-07-07 DIAGNOSIS — Z8546 Personal history of malignant neoplasm of prostate: Secondary | ICD-10-CM | POA: Insufficient documentation

## 2017-07-07 DIAGNOSIS — Z8679 Personal history of other diseases of the circulatory system: Secondary | ICD-10-CM | POA: Insufficient documentation

## 2017-07-07 DIAGNOSIS — M5441 Lumbago with sciatica, right side: Secondary | ICD-10-CM

## 2017-07-07 DIAGNOSIS — G8929 Other chronic pain: Secondary | ICD-10-CM | POA: Insufficient documentation

## 2017-07-09 ENCOUNTER — Other Ambulatory Visit: Payer: Self-pay

## 2017-07-09 ENCOUNTER — Ambulatory Visit (INDEPENDENT_AMBULATORY_CARE_PROVIDER_SITE_OTHER): Payer: PRIVATE HEALTH INSURANCE | Admitting: Family Medicine

## 2017-07-09 ENCOUNTER — Encounter: Payer: Self-pay | Admitting: Family Medicine

## 2017-07-09 DIAGNOSIS — M5441 Lumbago with sciatica, right side: Secondary | ICD-10-CM

## 2017-07-09 DIAGNOSIS — G8929 Other chronic pain: Secondary | ICD-10-CM

## 2017-07-09 MED ORDER — GABAPENTIN 100 MG PO CAPS: 100.0000 mg | ORAL_CAPSULE | Freq: Three times a day (TID) | ORAL | 0 refills | Status: DC

## 2017-07-09 NOTE — Patient Instructions (Signed)
It was very nice seeing you again today!  You were seen in clinic to discuss your right-sided sciatica which has recently been worse.  I have sent in a refill of her gabapentin 100 mg to be taken 3 times a day.  If you find that your symptoms are not well controlled with this, I would advise increasing to 200 mg 3 times a day.  Additionally, I would recommend using a heating pad and doing gentle range of motion exercises at home as this can also help alleviate symptoms. Please call clinic if you have any questions.  Be well, Lovenia Kim MD

## 2017-07-09 NOTE — Progress Notes (Signed)
   Subjective:   Patient ID: Timothy [name]    DOB: 1950/05/11, 67 y.o. male   MRN: [id number]  CC: f/u back pain   HPI: Timothy [name] is a 67 y.o. male who presents to clinic today to f/u for leg and back pain.  Sciatica  Patient reports chronic right-sided back pain that has worsened over the last 2 months.  4-5/10 in severity and radiates down right leg.  He reports he has fallen twice in the last month.  He has been taking gabapentin and Mobic for pain which provide relief.  He has not tried applying heating pads.  Uses a cane to ambulate at times.  Denies bowel or bladder incontinence.  He has had imaging of his back which showed some degenerative disc disease.  Requests refill of his gabapentin and Mobic.  No other issues at this time.   ROS: No fever, chills, nausea, vomiting.  Denies weakness, numbness, tingling.  Social: Occasional tobacco use  Medications reviewed.  Objective:   BP (!) 145/100 (BP Location: Right Arm, Patient Position: Sitting, Cuff Size: Normal)   Pulse 82   Temp 98.7 F (37.1 C) (Oral)   Ht 5\' 7"  (1.702 m)   Wt 141 lb 12.8 oz (64.3 kg)   SpO2 98%   BMI 22.21 kg/m  Vitals and nursing note reviewed.  General: 66 year old male, NAD CV: RRR no MRG Lungs: Clear bilaterally, normal effort Abdomen: soft, NT ND, positive bowel sounds MSK: Back-no obvious bony deformities, no overlying swelling or erythema, no tenderness to palpation over lumbar or paraspinal muscles, range of motion is normal Skin: warm, dry, no rashes or lesions, cap refill < 2 seconds Extremities: warm and well perfused, normal tone Neuro: Alert, oriented x3, positive straight leg test of right leg, normal sensation  Assessment & Plan:   Chronic back pain Reviewed thoracic and lumbar XR from 2012 showing degenerative disc disease at L3-4.  Suspect symptoms consistent with right-sided sciatica.  Advised use of heating pads and gentle range of motion exercises at home. -Could consider  obtaining new imaging if worsening symptoms -Rx: Gabapentin and Mobic refilled -Advised increasing gabapentin to 200 mg 3 times daily if needed as he has some room to go up on this - Follow-up 4-6 weeks   Meds ordered this encounter  Medications  . gabapentin (NEURONTIN) 100 MG capsule    Sig: Take 1 capsule (100 mg total) by mouth 3 (three) times daily.    Dispense:  60 capsule    Refill:  0   Lovenia Kim, MD [place], PGY-2 07/11/2017 3:19 PM

## 2017-07-11 NOTE — Assessment & Plan Note (Signed)
Reviewed thoracic and lumbar XR from 2012 showing degenerative disc disease at L3-4.  Suspect symptoms consistent with right-sided sciatica.  Advised use of heating pads and gentle range of motion exercises at home. -Could consider obtaining new imaging if worsening symptoms -Rx: Gabapentin and Mobic refilled -Advised increasing gabapentin to 200 mg 3 times daily if needed as he has some room to go up on this - Follow-up 4-6 weeks

## 2017-08-20 ENCOUNTER — Ambulatory Visit (INDEPENDENT_AMBULATORY_CARE_PROVIDER_SITE_OTHER): Payer: Medicare HMO | Admitting: Family Medicine

## 2017-08-20 ENCOUNTER — Other Ambulatory Visit: Payer: Self-pay

## 2017-08-20 ENCOUNTER — Encounter: Payer: Self-pay | Admitting: Family Medicine

## 2017-08-20 VITALS — BP 148/98 | HR 89 | Temp 98.5°F | Ht 67.0 in | Wt 141.6 lb

## 2017-08-20 DIAGNOSIS — M5441 Lumbago with sciatica, right side: Secondary | ICD-10-CM | POA: Diagnosis not present

## 2017-08-20 DIAGNOSIS — G8929 Other chronic pain: Secondary | ICD-10-CM | POA: Diagnosis not present

## 2017-08-20 DIAGNOSIS — L989 Disorder of the skin and subcutaneous tissue, unspecified: Secondary | ICD-10-CM

## 2017-08-20 MED ORDER — GABAPENTIN 100 MG PO CAPS: 100.0000 mg | ORAL_CAPSULE | Freq: Three times a day (TID) | ORAL | 3 refills | Status: DC

## 2017-08-20 MED ORDER — MELOXICAM 7.5 MG PO TABS: 15.0000 mg | ORAL_TABLET | Freq: Every day | ORAL | 3 refills | Status: DC

## 2017-08-20 NOTE — Assessment & Plan Note (Signed)
Stable, no new or worsening symptoms.  No red flags on exam. -Increase Mobic to 15 mg daily - Refill Gabapentin, continue 100 mg TID -Continue range of motion exercises as tolerated

## 2017-08-20 NOTE — Patient Instructions (Signed)
You were seen in clinic today for follow-up of back pain.  As we discussed, I suspect your symptoms are most likely due to sciatica.  I have increased your Mobic to 15 mg daily and you can continue to take 100 mg of gabapentin 3 times a day as needed.  Continue to do gentle range of motion exercises for your back.  If you have any new or worsening symptoms, please make sure you are seen by a provider in clinic.  Additionally, for the lesion on your scalp, I feel this is most likely benign.  However, this can be removed if you desire to do so.  I have placed a referral to dermatology for you and you can expect a call within a week or so.  Be well, Lovenia Kim MD

## 2017-08-20 NOTE — Assessment & Plan Note (Signed)
Suspect this is benign given history of trauma with overlying scar tissue on exam.  Patient desires removal.  No concern for infectious etiology at this time. - Referral placed to dermatology
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
## 2017-09-22 DIAGNOSIS — D485 Neoplasm of uncertain behavior of skin: Secondary | ICD-10-CM | POA: Diagnosis not present

## 2017-10-09 DIAGNOSIS — D485 Neoplasm of uncertain behavior of skin: Secondary | ICD-10-CM | POA: Diagnosis not present

## 2017-10-18 DIAGNOSIS — B078 Other viral warts: Secondary | ICD-10-CM | POA: Diagnosis not present

## 2017-11-06 DIAGNOSIS — B078 Other viral warts: Secondary | ICD-10-CM | POA: Diagnosis not present

## 2017-11-22 ENCOUNTER — Other Ambulatory Visit: Payer: Self-pay

## 2017-11-22 ENCOUNTER — Encounter: Payer: Self-pay | Admitting: Family Medicine

## 2017-11-22 ENCOUNTER — Ambulatory Visit (INDEPENDENT_AMBULATORY_CARE_PROVIDER_SITE_OTHER): Payer: Medicare HMO | Admitting: Family Medicine

## 2017-11-22 VITALS — BP 152/104 | HR 70 | Temp 98.9°F | Ht 66.0 in | Wt 139.4 lb

## 2017-11-22 DIAGNOSIS — M5441 Lumbago with sciatica, right side: Secondary | ICD-10-CM | POA: Diagnosis not present

## 2017-11-22 DIAGNOSIS — L989 Disorder of the skin and subcutaneous tissue, unspecified: Secondary | ICD-10-CM

## 2017-11-22 DIAGNOSIS — M5431 Sciatica, right side: Secondary | ICD-10-CM | POA: Diagnosis not present

## 2017-11-22 DIAGNOSIS — G8929 Other chronic pain: Secondary | ICD-10-CM

## 2017-11-22 DIAGNOSIS — Z23 Encounter for immunization: Secondary | ICD-10-CM

## 2017-11-22 DIAGNOSIS — Z1159 Encounter for screening for other viral diseases: Secondary | ICD-10-CM

## 2017-11-22 MED ORDER — DTAP-IPV VACCINE IM SUSP: 0.5000 mL | Freq: Once | INTRAMUSCULAR | 0 refills | Status: AC

## 2017-11-22 MED ORDER — PNEUMOCOCCAL VAC POLYVALENT 25 MCG/0.5ML IJ INJ
0.5000 mL | INJECTION | Freq: Once | INTRAMUSCULAR | Status: AC
  Administered 2017-11-22: 0.5 mL via INTRAMUSCULAR

## 2017-11-22 NOTE — Patient Instructions (Addendum)
You were seen in clinic today for follow up of your sciatica.  Since you are feeling better but still having some radiating nerve pain into your leg, I would recommend increasing your gabapentin to 100 mg three times a day (morning, afternoon and night).  You can go up to 200 mg three times a day if you feel like this is not helping.    I have placed an order for your tetanus shot and sent this to your pharmacy.  Please go to [facility] on [address] to get this injection.  You were given the pneumonia vaccination at clinic today.  Additionally, we screened you for hepatitis C as part of your normal health maintenance and I will call you once I have the results of this.   Please call clinic if you have any questions.   Lovenia Kim MD

## 2017-11-22 NOTE — Progress Notes (Signed)
   Subjective:   Patient ID: Timothy [name]    DOB: 06/09/1950, 67 y.o. male   MRN: [id number]  CC: f/u sciatica   HPI: Timothy [name] is a 67 y.o. male who presents to clinic today for the following issue.  Scalp lesion Patient following up after removal of scalp lesion.  Was previously referred to dermatology for this.  No issues since removal and the area no longer bothers her.  The area is nontender, has healed well, hair is starting to grow back over the location.  Denies fever, chills, nausea, vomiting.   Back pain with right-sided sciatica Pain has improved but still having some numbness and tingling of right leg.  Taking Gabapentin 100 mg once a day and Mobic 15 mg daily which provides some relief.  Pain is 4-5/10.  No fever, chills, nausea, vomiting.  No recent trauma or acute worsening.    Health Maintenance -due for Hepatitis C, pneumovac and tetanus shot -patient states he had a colonoscopy about 4 years ago at [place]: No fever, chills, nausea, vomiting.  No CP, SOB, leg swelling.   Social: pt is a current some day smoker.  Medications reviewed. Objective:   BP (!) 152/104 (BP Location: Left Arm, Patient Position: Sitting, Cuff Size: Normal)   Pulse 70   Temp 98.9 F (37.2 C) (Oral)   Ht 5\' 6"  (1.676 m)   Wt 139 lb 6.4 oz (63.2 kg)   SpO2 99%   BMI 22.50 kg/m  Vitals and nursing note reviewed.  General: 67 year old male, NAD  Scalp: well-healed scar s/p lesion removal, no tenderness, no bleeding or surrounding erythema noted  CV: RRR no MRG Lungs: CTA B, normal effort MSK: Lumbar spine-no obvious bony deformities, no overlying swelling or erythema, noTTP over lumbar or paraspinal muscles, ROM is normal, patient is standing bent forward over exam table as this is most comfortable to him  Skin: warm, dry, no rash Neuro: Alert, oriented x3, no focal deficits, +positive straight leg test (right leg)  Extremities: warm and well perfused, normal  tone  Assessment & Plan:   Chronic bilateral low back pain with right-sided sciatica Stable, ongoing numbness and tingling.  Patient taking gabapentin once daily instead of TID.  Will trial increase of gabapentin to 100 mg TID, may go up to 200 mg TID if needed as he has a lot of room to go up on this.  Pain prevents him from standing fully upright and he is leaning over the exam table.  Discussed several options including physical therapy which he declines at this time. He would like to continue home range of motion and stretching exercises in the meantime.   -continue Mobic, heating pad -will reconsider PT if patient agreeable  -follow-up prn   Scalp lesion S/p removal by dermatology.  The area has a well-healed scar, is nontender, with no surrounding erythema or swelling.  Continue to monitor.   Health maintenance -screen for Hep C  -pneumococcal vaccine given  -Rx sent to pharmacy for dtap vaccine   Timothy Kim, MD [place], PGY-3 11/24/2017 4:19 PM

## 2017-11-24 NOTE — Assessment & Plan Note (Signed)
S/p removal by dermatology.  The area has a well-healed scar, is nontender, with no surrounding erythema or swelling.  Continue to monitor.

## 2017-11-24 NOTE — Assessment & Plan Note (Addendum)
Stable, ongoing numbness and tingling.  Patient taking gabapentin once daily instead of TID.  Will trial increase of gabapentin to 100 mg TID, may go up to 200 mg TID if needed as he has a lot of room to go up on this.  Pain prevents him from standing fully upright and he is leaning over the exam table.  Discussed several options including physical therapy which he declines at this time. He would like to continue home range of motion and stretching exercises in the meantime.   -continue Mobic, heating pad -will reconsider PT if patient agreeable  -follow-up prn

## 2017-11-26 ENCOUNTER — Other Ambulatory Visit: Payer: Self-pay | Admitting: Family Medicine

## 2017-11-26 ENCOUNTER — Telehealth: Payer: Self-pay | Admitting: *Deleted

## 2017-11-26 MED ORDER — TETANUS-DIPHTH-ACELL PERTUSSIS 5-2.5-18.5 LF-MCG/0.5 IM SUSP: 0.5000 mL | Freq: Once | INTRAMUSCULAR | 0 refills | Status: AC

## 2017-11-26 NOTE — Telephone Encounter (Signed)
I apologize it was meant to be TDap. I have ordered the correct vaccination (Boostrix) and sent to pharmacy.

## 2017-11-26 NOTE — Telephone Encounter (Signed)
Perfect! Thanks.  I just wanted to make sure I was not missing anything. Fleeger, Salome Spotted, CMA

## 2017-11-26 NOTE — Telephone Encounter (Signed)
[facility] called. Kinrix was sent in and dtap was documented.    Will verify with provider that she meant TDAP and have her send it in again. Fleeger, Salome Spotted, CMA

## 2018-01-26 ENCOUNTER — Ambulatory Visit (INDEPENDENT_AMBULATORY_CARE_PROVIDER_SITE_OTHER): Payer: Medicare HMO | Admitting: Family Medicine

## 2018-01-26 VITALS — BP 140/70 | HR 74 | Temp 98.8°F | Wt 144.2 lb

## 2018-01-26 DIAGNOSIS — R319 Hematuria, unspecified: Secondary | ICD-10-CM | POA: Diagnosis not present

## 2018-01-26 DIAGNOSIS — G8929 Other chronic pain: Secondary | ICD-10-CM

## 2018-01-26 DIAGNOSIS — M5441 Lumbago with sciatica, right side: Secondary | ICD-10-CM | POA: Diagnosis not present

## 2018-01-26 DIAGNOSIS — R310 Gross hematuria: Secondary | ICD-10-CM | POA: Diagnosis not present

## 2018-01-26 DIAGNOSIS — Z23 Encounter for immunization: Secondary | ICD-10-CM | POA: Diagnosis not present

## 2018-01-26 LAB — POCT URINALYSIS DIP (MANUAL ENTRY)
BILIRUBIN UA: NEGATIVE mg/dL
Bilirubin, UA: NEGATIVE
Blood, UA: NEGATIVE
GLUCOSE UA: NEGATIVE mg/dL
LEUKOCYTES UA: NEGATIVE
Nitrite, UA: NEGATIVE
SPEC GRAV UA: 1.02 (ref 1.010–1.025)
Urobilinogen, UA: 0.2 E.U./dL
pH, UA: 7 (ref 5.0–8.0)

## 2018-01-26 MED ORDER — GABAPENTIN 300 MG PO CAPS: 300.0000 mg | ORAL_CAPSULE | Freq: Three times a day (TID) | ORAL | 2 refills | Status: DC

## 2018-01-26 NOTE — Patient Instructions (Signed)
It was nice seeing you again today.  You were seen in clinic for bilateral leg pain and blood in your urine.  We discussed increasing your gabapentin to 300 mg 3 times a day.  I have sent this to your pharmacy and you can begin taking it today.   For your urine, we have ordered some lab tests and I will call you once I have the results of this.  You were given a flu shot today.  Please call clinic if you have any questions.  Lovenia Kim MD

## 2018-01-26 NOTE — Progress Notes (Signed)
   Subjective:   Patient ID: Timothy [name]    DOB: Dec 17, 1950, 67 y.o. male   MRN: [id number]  CC: bilateral leg pain, blood in urine  HPI: Timothy [name] is a 67 y.o. male who presents to clinic today for the following issues.  Leg pain Chronic bilateral leg pain, getting worse recently.  He is currently taking gabapentin 100 mg TID which helps some.  Reports numbness and tingling, no weakness noted.  He denies falls or h/o recent trauma/injury.  Finds that his leg pain sometimes throws him off balance. No fever, chills, dizziness, nausea or vomiting.    Blood in urine  Noticed it about a month ago.  He states the blood was bright red in toilet bowl, but has not occurred since.  He mentioned it to his gf who is present at visit today and concerned.  He denies flank pain, abdominal pain.  No fevers, chills or urinary symptoms of dysuria, frequency or urgency.  No change in bowel pattern. Urination is not painful and his stream is normal.  He denies night sweats, cough and weight loss.  No known family h/o bladder cancer.  He denies tobacco use but occasionally has been smoking cigars.   Health Maintenance: -flu shot due   ROS: See HPI for pertinent ROS.  Social: occasional cigar smoker.  Medications reviewed. Objective:   BP 140/70   Pulse 74   Temp 98.8 F (37.1 C) (Oral)   Wt 144 lb 3.2 oz (65.4 kg)   SpO2 99%   BMI 23.27 kg/m  Vitals and nursing note reviewed.  General: 67 yo male, NAD  HEENT: NCAT, EOMI, PERRL, MMM Neck: supple CV: RRR no MRG  Lungs: CTAB, normal effort  Abdomen: soft, NTND, no masses or organomegaly, +bs  Genitourinary: no flank pain present  Skin: warm, dry, no rash Extremities: warm and well perfused  Assessment & Plan:   Chronic bilateral low back pain with right-sided sciatica Worsening. Trial increasing gabapentin to 300 mg TID for ongoing symptoms of leg pain with numbness and tingling. Will reconsider PT at next visit .   Hematuria,  gross Patient reports one episode of gross hematuria last month which has not since recurred.  Denies consuming any colored drinks which may have been causing red appearance.  Not on any medications to cause orange/red urine.  He is currently asymptomatic with normal exam.  Will obtain UA with microscopy today for further evaluation.  With no known h/o bladder cancer however occasional cigar smoker for many years.  Will place referral to urology for workup. Discussed with pt and he is agreeable to this. Return precautions discussed.   Health maintenance: -flu shot given today   Orders Placed This Encounter  Procedures  . Flu Vaccine QUAD 36+ mos IM  . Urine Microscopic  . Ambulatory referral to Urology    Referral Priority:   Routine    Referral Type:   Consultation    Referral Reason:   Specialty Services Required    Requested Specialty:   Urology    Number of Visits Requested:   1  . POCT urinalysis dipstick   Meds ordered this encounter  Medications  . gabapentin (NEURONTIN) 300 MG capsule    Sig: Take 1 capsule (300 mg total) by mouth 3 (three) times daily.    Dispense:  90 capsule    Refill:  2   Lovenia Kim, MD [place], PGY-3 01/31/2018 8:53 PM

## 2018-01-31 DIAGNOSIS — R310 Gross hematuria: Secondary | ICD-10-CM | POA: Insufficient documentation

## 2018-01-31 NOTE — Assessment & Plan Note (Addendum)
Patient reports one episode of gross hematuria last month which has not since recurred.  Denies consuming any colored drinks which may have been causing red appearance.  Not on any medications to cause orange/red urine.  He is currently asymptomatic with normal exam.  Will obtain UA with microscopy today for further evaluation.  With no known h/o bladder cancer however occasional cigar smoker for many years.  Will place referral to urology for workup. Discussed with pt and he is agreeable to this. Return precautions discussed.

## 2018-01-31 NOTE — Assessment & Plan Note (Signed)
Worsening. Trial increasing gabapentin to 300 mg TID for ongoing symptoms of leg pain with numbness and tingling. Will reconsider PT at next visit .

## 2018-03-14 ENCOUNTER — Ambulatory Visit: Payer: Medicare HMO | Attending: Family Medicine | Admitting: Family Medicine

## 2018-03-14 ENCOUNTER — Encounter: Payer: Self-pay | Admitting: Family Medicine

## 2018-03-14 VITALS — BP 160/100 | HR 72 | Temp 98.5°F | Ht 66.0 in | Wt 145.2 lb

## 2018-03-14 DIAGNOSIS — K0889 Other specified disorders of teeth and supporting structures: Secondary | ICD-10-CM

## 2018-03-14 DIAGNOSIS — Z8546 Personal history of malignant neoplasm of prostate: Secondary | ICD-10-CM | POA: Diagnosis not present

## 2018-03-14 DIAGNOSIS — Z79899 Other long term (current) drug therapy: Secondary | ICD-10-CM | POA: Diagnosis not present

## 2018-03-14 DIAGNOSIS — Z8249 Family history of ischemic heart disease and other diseases of the circulatory system: Secondary | ICD-10-CM | POA: Insufficient documentation

## 2018-03-14 DIAGNOSIS — G8929 Other chronic pain: Secondary | ICD-10-CM | POA: Diagnosis not present

## 2018-03-14 DIAGNOSIS — I10 Essential (primary) hypertension: Secondary | ICD-10-CM | POA: Diagnosis not present

## 2018-03-14 DIAGNOSIS — M5442 Lumbago with sciatica, left side: Secondary | ICD-10-CM | POA: Diagnosis not present

## 2018-03-14 DIAGNOSIS — R310 Gross hematuria: Secondary | ICD-10-CM

## 2018-03-14 DIAGNOSIS — M5441 Lumbago with sciatica, right side: Secondary | ICD-10-CM

## 2018-03-14 LAB — POCT URINALYSIS DIP (CLINITEK)
Bilirubin, UA: NEGATIVE
Blood, UA: NEGATIVE
Glucose, UA: NEGATIVE mg/dL
Ketones, POC UA: NEGATIVE mg/dL
Leukocytes, UA: NEGATIVE
Nitrite, UA: NEGATIVE
POC PROTEIN,UA: 30 — AB
Spec Grav, UA: 1.02
Urobilinogen, UA: 1 U/dL
pH, UA: 6

## 2018-03-14 MED ORDER — AMOXICILLIN 500 MG PO CAPS: 500.0000 mg | ORAL_CAPSULE | Freq: Two times a day (BID) | ORAL | 0 refills | Status: DC

## 2018-03-14 MED ORDER — GABAPENTIN 300 MG PO CAPS: 300.0000 mg | ORAL_CAPSULE | Freq: Three times a day (TID) | ORAL | 3 refills | Status: DC

## 2018-03-14 MED ORDER — AMLODIPINE BESYLATE 5 MG PO TABS: 5.0000 mg | ORAL_TABLET | Freq: Every day | ORAL | 3 refills | Status: DC

## 2018-03-14 MED ORDER — MELOXICAM 7.5 MG PO TABS: 15.0000 mg | ORAL_TABLET | Freq: Every day | ORAL | 3 refills | Status: DC

## 2018-03-14 NOTE — Progress Notes (Signed)
Subjective:    Patient ID: Timothy [name], male    DOB: [birth year], 67 y.o.   MRN: [id number]  HPI       67 year old male new to the practice.  Patient reports a past medical history significant for episodes of syncope a few years ago for which he had placement of a heart monitor but states that no problem was found and the monitor was removed.   Patient also reports history of prostate surgery to help with an enlarged prostate.  Patient states that his urination has improved and he now urinates only once per night.  Patient also has history of low back pain with radiation down both legs since 2008 or 2019.  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  Patient reports that he has seen a specialist and had imaging.  Patient states that he retired secondary to his chronic low back pain.  Patient states that he was actually told by his company that he needed to retire.  Patient states that his back pain is always about a 9 on a 0-to-10 scale.  Patient states that the back pain is sharp and the pain that goes down his legs that sharp and with certain movements he has a shooting pain that radiates down his legs.  Patient states that the pain generally more so is stronger on the right than the left but can go down both sides of the legs and stops at about the mid calf.  Patient takes meloxicam usually 7.5 mg once daily and gabapentin 300 mg at least twice a day.  Patient states that both medications are prescribed so that he could take an additional pill daily but patient states that he tries to avoid medication when possible.      At today's visit, patient also reports recent onset of a toothache and gum swelling on the left lower side of his mouth.  Patient reports that he has poor dentition.  Patient would like to know whom he can see in  follow-up of his toothache and dentition.  Patient denies any difficulty swallowing.  Patient does have a history of use of cigarettes and cigars.  Patient reports that he does not smoke on a daily basis.  Patient states that his use of cigars and cigarettes is a few times per month.  Patient denies any alcohol use.  Patient reports a family history of his mother having hypertension.  Patient reports no family history of cancers, no diabetes or strokes.      Patient denies any significant urinary symptoms at today's visit.  Patient urinates about once per night.  Patient denies dysuria.  Patient reports that he did have an episode in October of seeing bright red blood in the urine.  Patient denies any current lower abdominal pain but does have chronic low back pain.  Patient denies any fever or chills.  Patient states that he did see his primary care physician regarding the blood in his urine.  Patient has not had any recent follow-up with urology.      Patient denies any headaches or dizziness related to his high blood pressure.  Patient reports that he has never been on blood pressure medication in the past but has checked his blood pressure at home with his wife's monitor  and patient's blood pressure has been high.  Patient states that his home blood pressures have been running in the levels consistent with his blood pressure today's visit.  Patient denies any issues of chest pain or palpitations.   Review of Systems  Constitutional: Negative for chills, fatigue and fever.  HENT: Positive for dental problem. Negative for hearing loss.   Eyes: Negative for photophobia and visual disturbance.  Respiratory: Negative for cough and shortness of breath.   Cardiovascular: Negative for chest pain, palpitations and leg swelling.  Gastrointestinal: Negative for abdominal pain, blood in stool, constipation and diarrhea.  Genitourinary: Negative for dysuria and frequency.       Episode of hematuria in October  but no subsequent visible blood in the urine  Musculoskeletal: Positive for arthralgias and back pain.  Neurological: Negative for dizziness and headaches.  Hematological: Negative for adenopathy. Does not bruise/bleed easily.       Objective:   Physical Exam BP (!) 160/100   Pulse 72   Temp 98.5 F (36.9 C) (Oral)   Ht 5\' 6"  (1.676 m)   Wt 145 lb 3.2 oz (65.9 kg)   SpO2 97%   BMI 23.44 kg/m  Nurse's notes and vital signs reviewed General-well-nourished, well-developed older male in no acute distress, patient is accompanied by his wife at today's visit ENT-TMs gray, nares with mild edema of the nasal turbinates, patient with poor dentition and has some tooth loss as well as edema/erythema in the left lower teeth at the front and side of the mouth Neck-supple, patient with some mild left anterior cervical chain lymphadenopathy which is nontender, no thyromegaly, no carotid bruit Lungs-clear to auscultation bilaterally Cardiovascular-regular rate and rhythm Abdomen-soft, nontender Back-no CVA tenderness, patient with lumbosacral discomfort to palpation and patient with lumbar paraspinous spasm, patient with positive seated leg raise Extremities-no edema       Assessment & Plan:  1. Chronic bilateral low back pain with right-sided sciatica Patient with complaint of chronic bilateral low back pain with sciatica that is mostly right-sided.  Patient is provided with a refill of gabapentin as well as Mobic which patient has been taking to help with pain.  Patient denies any issues with abdominal pain no blood in the stool related to his use of meloxicam.  Patient will have CMP and CBC in follow-up of medication use.  Patient will be referred to pain clinic as he states that he has been seen there in the past.  On review of chart, patient has had an x-ray of the lumbar spine done in 2012 showing degenerative disc disease with joint space narrowing at L3-L4. - gabapentin (NEURONTIN) 300 MG  capsule; Take 1 capsule (300 mg total) by mouth 3 (three) times daily.  Dispense: 90 capsule; Refill: 3 - meloxicam (MOBIC) 7.5 MG tablet; Take 2 tablets (15 mg total) by mouth daily.  Dispense: 60 tablet; Refill: 3 - Comprehensive metabolic panel - CBC with Differential - Ambulatory referral to Pain Clinic  2. Hematuria, gross Patient reports an episode of gross hematuria in October.  Patient will have repeat urinalysis at today's visit but patient will also be referred to urology for further evaluation. - POCT URINALYSIS DIP (CLINITEK)  3. Essential hypertension Patient's blood pressure was elevated at today's visit at 160/100 and patient with primary care visit on 11/22/2017 with blood pressure 152/104 on review of chart.  Patient agrees to start medication for the treatment of hypertension.  Prescription provided for amlodipine 5 mg to take once daily.  Patient will have CMP and lipid panel as well as urinalysis done in follow-up of hypertension. - amLODipine (NORVASC) 5 MG tablet; Take 1 tablet (5 mg total) by mouth daily. To lower blood pressure  Dispense: 30 tablet; Refill: 3 - Comprehensive metabolic panel - Lipid panel - POCT URINALYSIS DIP (CLINITEK)  4. History of prostate cancer Patient reports a history of prostate cancer and patient has also had recent gross hematuria.  Patient is being referred to urology for further evaluation and treatment.  5. Encounter for long-term (current) use of high-risk medication Patient will have CMP, CBC and urinalysis in follow-up of long-term use of high-risk medication for treatment of pain - Comprehensive metabolic panel - CBC with Differential - POCT URINALYSIS DIP (CLINITEK)  6. Toothache Patient with complaint of a toothache and patient is being placed on amoxicillin and patient is to follow-up with dentistry - amoxicillin (AMOXIL) 500 MG capsule; Take 1 capsule (500 mg total) by mouth 2 (two) times daily.  Dispense: 20 capsule; Refill:  0  *Patient reports that he is already received his influenza immunization  An After Visit Summary was printed and given to the patient.  Return in about 4 weeks (around 04/11/2018) for HTN.

## 2018-03-14 NOTE — Patient Instructions (Signed)
Hypertension Hypertension is another name for high blood pressure. High blood pressure forces your heart to work harder to pump blood. This can cause problems over time. There are two numbers in a blood pressure reading. There is a top number (systolic) over a bottom number (diastolic). It is best to have a blood pressure below 120/80. Healthy choices can help lower your blood pressure. You may need medicine to help lower your blood pressure if:  Your blood pressure cannot be lowered with healthy choices.  Your blood pressure is higher than 130/80.  Follow these instructions at home: Eating and drinking  If directed, follow the DASH eating plan. This diet includes: ? Filling half of your plate at each meal with fruits and vegetables. ? Filling one quarter of your plate at each meal with whole grains. Whole grains include whole wheat pasta, brown rice, and whole grain bread. ? Eating or drinking low-fat dairy products, such as skim milk or low-fat yogurt. ? Filling one quarter of your plate at each meal with low-fat (lean) proteins. Low-fat proteins include fish, skinless chicken, eggs, beans, and tofu. ? Avoiding fatty meat, cured and processed meat, or chicken with skin. ? Avoiding premade or processed food.  Eat less than 1,500 mg of salt (sodium) a day.  Limit alcohol use to no more than 1 drink a day for nonpregnant women and 2 drinks a day for men. One drink equals 12 oz of beer, 5 oz of wine, or 1 oz of hard liquor. Lifestyle  Work with your doctor to stay at a healthy weight or to lose weight. Ask your doctor what the best weight is for you.  Get at least 30 minutes of exercise that causes your heart to beat faster (aerobic exercise) most days of the week. This may include walking, swimming, or biking.  Get at least 30 minutes of exercise that strengthens your muscles (resistance exercise) at least 3 days a week. This may include lifting weights or pilates.  Do not use any  products that contain nicotine or tobacco. This includes cigarettes and e-cigarettes. If you need help quitting, ask your doctor.  Check your blood pressure at home as told by your doctor.  Keep all follow-up visits as told by your doctor. This is important. Medicines  Take over-the-counter and prescription medicines only as told by your doctor. Follow directions carefully.  Do not skip doses of blood pressure medicine. The medicine does not work as well if you skip doses. Skipping doses also puts you at risk for problems.  Ask your doctor about side effects or reactions to medicines that you should watch for. Contact a doctor if:  You think you are having a reaction to the medicine you are taking.  You have headaches that keep coming back (recurring).  You feel dizzy.  You have swelling in your ankles.  You have trouble with your vision. Get help right away if:  You get a very bad headache.  You start to feel confused.  You feel weak or numb.  You feel faint.  You get very bad pain in your: ? Chest. ? Belly (abdomen).  You throw up (vomit) more than once.  You have trouble breathing. Summary  Hypertension is another name for high blood pressure.  Making healthy choices can help lower blood pressure. If your blood pressure cannot be controlled with healthy choices, you may need to take medicine. This information is not intended to replace advice given to you by your health care   provider. Make sure you discuss any questions you have with your health care provider. Document Released: 09/23/2007 Document Revised: 03/04/2016 Document Reviewed: 03/04/2016 Elsevier Interactive Patient Education  2018 [place] Eating Plan DASH stands for "Dietary Approaches to Stop Hypertension." The DASH eating plan is a healthy eating plan that has been shown to reduce high blood pressure (hypertension). It may also reduce your risk for type 2 diabetes, heart disease, and  stroke. The DASH eating plan may also help with weight loss. What are tips for following this plan? General guidelines  Avoid eating more than 2,300 mg (milligrams) of salt (sodium) a day. If you have hypertension, you may need to reduce your sodium intake to 1,500 mg a day.  Limit alcohol intake to no more than 1 drink a day for nonpregnant women and 2 drinks a day for men. One drink equals 12 oz of beer, 5 oz of wine, or 1 oz of hard liquor.  Work with your health care provider to maintain a healthy body weight or to lose weight. Ask what an ideal weight is for you.  Get at least 30 minutes of exercise that causes your heart to beat faster (aerobic exercise) most days of the week. Activities may include walking, swimming, or biking.  Work with your health care provider or diet and nutrition specialist (dietitian) to adjust your eating plan to your individual calorie needs. Reading food labels  Check food labels for the amount of sodium per serving. Choose foods with less than 5 percent of the Daily Value of sodium. Generally, foods with less than 300 mg of sodium per serving fit into this eating plan.  To find whole grains, look for the word "whole" as the first word in the ingredient list. Shopping  Buy products labeled as "low-sodium" or "no salt added."  Buy fresh foods. Avoid canned foods and premade or frozen meals. Cooking  Avoid adding salt when cooking. Use salt-free seasonings or herbs instead of table salt or sea salt. Check with your health care provider or pharmacist before using salt substitutes.  Do not fry foods. Cook foods using healthy methods such as baking, boiling, grilling, and broiling instead.  Cook with heart-healthy oils, such as olive, canola, soybean, or sunflower oil. Meal planning   Eat a balanced diet that includes: ? 5 or more servings of fruits and vegetables each day. At each meal, try to fill half of your plate with fruits and vegetables. ? Up  to 6-8 servings of whole grains each day. ? Less than 6 oz of lean meat, poultry, or fish each day. A 3-oz serving of meat is about the same size as a deck of cards. One egg equals 1 oz. ? 2 servings of low-fat dairy each day. ? A serving of nuts, seeds, or beans 5 times each week. ? Heart-healthy fats. Healthy fats called Omega-3 fatty acids are found in foods such as flaxseeds and coldwater fish, like sardines, salmon, and mackerel.  Limit how much you eat of the following: ? Canned or prepackaged foods. ? Food that is high in trans fat, such as fried foods. ? Food that is high in saturated fat, such as fatty meat. ? Sweets, desserts, sugary drinks, and other foods with added sugar. ? Full-fat dairy products.  Do not salt foods before eating.  Try to eat at least 2 vegetarian meals each week.  Eat more home-cooked food and less restaurant, buffet, and fast food.  When eating at a restaurant,  Canned or prepackaged foods.  ? Food that is high in trans fat, such as fried foods.  ? Food that is high in saturated fat, such as fatty meat.  ? Sweets, desserts, sugary drinks, and other foods with added sugar.  ? Full-fat dairy products.   Do not salt foods before eating.   Try to eat at least 2 vegetarian meals each week.   Eat more home-cooked food and less restaurant, buffet, and fast food.   When eating at a restaurant, ask that your food be prepared with less salt or no salt, if possible.  What foods are recommended?  The items listed may not be a complete list. Talk with your dietitian about what dietary choices are best for you.  Grains  Whole-grain or whole-wheat bread. Whole-grain or whole-wheat pasta. Brown rice. Oatmeal. Quinoa. Bulgur. Whole-grain and low-sodium cereals. Pita bread. Low-fat, low-sodium crackers. Whole-wheat flour tortillas.  Vegetables  Fresh or frozen vegetables (raw, steamed, roasted, or grilled). Low-sodium or reduced-sodium tomato and vegetable juice. Low-sodium or reduced-sodium tomato sauce and tomato paste. Low-sodium or reduced-sodium canned vegetables.  Fruits  All fresh, dried, or frozen fruit. Canned fruit in natural juice (without added sugar).  Meat and other protein foods  Skinless chicken or turkey. Ground chicken or turkey. Pork with fat trimmed off. Fish and seafood. Egg whites. Dried beans, peas, or lentils. Unsalted nuts, nut butters, and seeds. Unsalted canned beans. Lean cuts of  beef with fat trimmed off. Low-sodium, lean deli meat.  Dairy  Low-fat (1%) or fat-free (skim) milk. Fat-free, low-fat, or reduced-fat cheeses. Nonfat, low-sodium ricotta or cottage cheese. Low-fat or nonfat yogurt. Low-fat, low-sodium cheese.  Fats and oils  Soft margarine without trans fats. Vegetable oil. Low-fat, reduced-fat, or light mayonnaise and salad dressings (reduced-sodium). Canola, safflower, olive, soybean, and sunflower oils. Avocado.  Seasoning and other foods  Herbs. Spices. Seasoning mixes without salt. Unsalted popcorn and pretzels. Fat-free sweets.  What foods are not recommended?  The items listed may not be a complete list. Talk with your dietitian about what dietary choices are best for you.  Grains  Baked goods made with fat, such as croissants, muffins, or some breads. Dry pasta or rice meal packs.  Vegetables  Creamed or fried vegetables. Vegetables in a cheese sauce. Regular canned vegetables (not low-sodium or reduced-sodium). Regular canned tomato sauce and paste (not low-sodium or reduced-sodium). Regular tomato and vegetable juice (not low-sodium or reduced-sodium). Pickles. Olives.  Fruits  Canned fruit in a light or heavy syrup. Fried fruit. Fruit in cream or butter sauce.  Meat and other protein foods  Fatty cuts of meat. Ribs. Fried meat. Bacon. Sausage. Bologna and other processed lunch meats. Salami. Fatback. Hotdogs. Bratwurst. Salted nuts and seeds. Canned beans with added salt. Canned or smoked fish. Whole eggs or egg yolks. Chicken or turkey with skin.  Dairy  Whole or 2% milk, cream, and half-and-half. Whole or full-fat cream cheese. Whole-fat or sweetened yogurt. Full-fat cheese. Nondairy creamers. Whipped toppings. Processed cheese and cheese spreads.  Fats and oils  Butter. Stick margarine. Lard. Shortening. Ghee. Bacon fat. Tropical oils, such as coconut, palm kernel, or palm oil.  Seasoning and other foods  Salted popcorn and pretzels. Onion salt, garlic salt, seasoned  salt, table salt, and sea salt. Worcestershire sauce. Tartar sauce. Barbecue sauce. Teriyaki sauce. Soy sauce, including reduced-sodium. Steak sauce. Canned and packaged gravies. Fish sauce. Oyster sauce. Cocktail sauce. Horseradish that you find on the shelf. Ketchup. Mustard. Meat flavorings and tenderizers.   fruits and vegetables, whole grains, lean proteins, low-fat dairy, and heart-healthy fats.  Work with your health care provider or diet and nutrition specialist (dietitian) to adjust your eating plan to your individual calorie needs. This information is not intended to replace advice given to you by your health care provider. Make sure you discuss any questions you have with your health care provider. Document Released: 03/26/2011 Document Revised: 03/30/2016 Document Reviewed: 03/30/2016 Elsevier Interactive Patient Education  [employer].

## 2018-03-15 LAB — CBC WITH DIFFERENTIAL/PLATELET
Basophils Absolute: 0 x10E3/uL (ref 0.0–0.2)
Basos: 0 %
EOS (ABSOLUTE): 0.6 x10E3/uL — ABNORMAL HIGH (ref 0.0–0.4)
Eos: 11 %
Hematocrit: 39.7 % (ref 37.5–51.0)
Hemoglobin: 13.6 g/dL (ref 13.0–17.7)
Immature Grans (Abs): 0 x10E3/uL (ref 0.0–0.1)
Immature Granulocytes: 0 %
Lymphocytes Absolute: 2.3 x10E3/uL (ref 0.7–3.1)
Lymphs: 42 %
MCH: 30.8 pg (ref 26.6–33.0)
MCHC: 34.3 g/dL (ref 31.5–35.7)
MCV: 90 fL (ref 79–97)
Monocytes Absolute: 0.7 x10E3/uL (ref 0.1–0.9)
Monocytes: 13 %
Neutrophils Absolute: 1.9 x10E3/uL (ref 1.4–7.0)
Neutrophils: 34 %
Platelets: 171 x10E3/uL (ref 150–450)
RBC: 4.41 x10E6/uL (ref 4.14–5.80)
RDW: 13.4 % (ref 12.3–15.4)
WBC: 5.5 x10E3/uL (ref 3.4–10.8)

## 2018-03-15 LAB — COMPREHENSIVE METABOLIC PANEL WITH GFR
ALT: 8 IU/L (ref 0–44)
AST: 12 IU/L (ref 0–40)
Albumin/Globulin Ratio: 1.8 (ref 1.2–2.2)
Albumin: 4.6 g/dL (ref 3.6–4.8)
Alkaline Phosphatase: 51 IU/L (ref 39–117)
BUN/Creatinine Ratio: 10 (ref 10–24)
BUN: 14 mg/dL (ref 8–27)
Bilirubin Total: 0.5 mg/dL (ref 0.0–1.2)
CO2: 24 mmol/L (ref 20–29)
Calcium: 9.5 mg/dL (ref 8.6–10.2)
Chloride: 101 mmol/L (ref 96–106)
Creatinine, Ser: 1.35 mg/dL — ABNORMAL HIGH (ref 0.76–1.27)
GFR calc Af Amer: 63 mL/min/1.73
GFR calc non Af Amer: 54 mL/min/1.73 — ABNORMAL LOW
Globulin, Total: 2.6 g/dL (ref 1.5–4.5)
Glucose: 75 mg/dL (ref 65–99)
Potassium: 4.5 mmol/L (ref 3.5–5.2)
Sodium: 141 mmol/L (ref 134–144)
Total Protein: 7.2 g/dL (ref 6.0–8.5)

## 2018-03-15 LAB — LIPID PANEL
Chol/HDL Ratio: 2.9 ratio (ref 0.0–5.0)
Cholesterol, Total: 125 mg/dL (ref 100–199)
HDL: 43 mg/dL
LDL Calculated: 67 mg/dL (ref 0–99)
Triglycerides: 77 mg/dL (ref 0–149)
VLDL Cholesterol Cal: 15 mg/dL (ref 5–40)

## 2018-03-17 ENCOUNTER — Encounter: Payer: Self-pay | Admitting: Family Medicine

## 2018-03-30 ENCOUNTER — Telehealth: Payer: Self-pay | Admitting: *Deleted

## 2018-04-06 DIAGNOSIS — M545 Low back pain: Secondary | ICD-10-CM | POA: Diagnosis not present

## 2018-04-06 DIAGNOSIS — Z79899 Other long term (current) drug therapy: Secondary | ICD-10-CM | POA: Diagnosis not present

## 2018-04-06 DIAGNOSIS — M129 Arthropathy, unspecified: Secondary | ICD-10-CM | POA: Diagnosis not present

## 2018-04-06 DIAGNOSIS — G894 Chronic pain syndrome: Secondary | ICD-10-CM | POA: Diagnosis not present

## 2018-04-11 ENCOUNTER — Ambulatory Visit: Payer: Medicare HMO | Attending: Family Medicine | Admitting: Family Medicine

## 2018-04-11 ENCOUNTER — Encounter: Payer: Self-pay | Admitting: Family Medicine

## 2018-04-11 VITALS — BP 163/90 | HR 73 | Temp 98.7°F | Resp 18 | Ht 66.0 in | Wt 147.0 lb

## 2018-04-11 DIAGNOSIS — Z8249 Family history of ischemic heart disease and other diseases of the circulatory system: Secondary | ICD-10-CM | POA: Insufficient documentation

## 2018-04-11 DIAGNOSIS — F17290 Nicotine dependence, other tobacco product, uncomplicated: Secondary | ICD-10-CM | POA: Insufficient documentation

## 2018-04-11 DIAGNOSIS — I129 Hypertensive chronic kidney disease with stage 1 through stage 4 chronic kidney disease, or unspecified chronic kidney disease: Secondary | ICD-10-CM | POA: Insufficient documentation

## 2018-04-11 DIAGNOSIS — Z79899 Other long term (current) drug therapy: Secondary | ICD-10-CM | POA: Diagnosis not present

## 2018-04-11 DIAGNOSIS — Z8546 Personal history of malignant neoplasm of prostate: Secondary | ICD-10-CM | POA: Diagnosis not present

## 2018-04-11 DIAGNOSIS — R351 Nocturia: Secondary | ICD-10-CM | POA: Insufficient documentation

## 2018-04-11 DIAGNOSIS — N182 Chronic kidney disease, stage 2 (mild): Secondary | ICD-10-CM | POA: Diagnosis not present

## 2018-04-11 DIAGNOSIS — R350 Frequency of micturition: Secondary | ICD-10-CM | POA: Diagnosis not present

## 2018-04-11 DIAGNOSIS — I10 Essential (primary) hypertension: Secondary | ICD-10-CM | POA: Diagnosis not present

## 2018-04-11 DIAGNOSIS — G8929 Other chronic pain: Secondary | ICD-10-CM

## 2018-04-11 DIAGNOSIS — M5441 Lumbago with sciatica, right side: Secondary | ICD-10-CM

## 2018-04-11 LAB — POCT URINALYSIS DIP (CLINITEK)
Bilirubin, UA: NEGATIVE
Blood, UA: NEGATIVE
Glucose, UA: NEGATIVE mg/dL
Ketones, POC UA: NEGATIVE mg/dL
Leukocytes, UA: NEGATIVE
Nitrite, UA: NEGATIVE
POC PROTEIN,UA: 100 — AB
Spec Grav, UA: 1.025
Urobilinogen, UA: 1 U/dL
pH, UA: 5.5

## 2018-04-11 MED ORDER — SULFAMETHOXAZOLE-TRIMETHOPRIM 800-160 MG PO TABS: 1.0000 | ORAL_TABLET | Freq: Two times a day (BID) | ORAL | 0 refills | Status: AC

## 2018-04-11 MED ORDER — AMLODIPINE BESYLATE 10 MG PO TABS: 10.0000 mg | ORAL_TABLET | Freq: Every day | ORAL | 1 refills | Status: DC

## 2018-04-11 NOTE — Progress Notes (Signed)
Subjective:    Patient ID: Timothy [name], adult    DOB: Jan 31, 1951, 67 y.o.   MRN: [id number]  HPI      67 yo male seen in follow-up of hypertension and patient with chronic low back pain with right leg radiation.  Patient continues to have issues with his back pain.  Patient has been seen by pain management at [facility] and has upcoming follow-up appointment.  Patient is accompanied by a family member who states that patient does continue to have issues with his chronic back pain even though patient is not really complaining.  Patient's home blood pressures remaining elevated with values in the 938B systolic.  Patient denies any headaches or dizziness related to his blood pressure.      Patient with continued low back pain with radiation and pain is at times a 10 out of 10.  Patient did not answer when asked about whether gabapentin was helping but patient's friend states that she does not believe that this is really helping with his symptoms.  Patient did have recent x-ray done per pain management and again has an upcoming appointment.  Patient has noticed a recent increase in frequency of urination but denies dysuria.  Patient does have a history of prostate cancer.  Patient does have issues with nocturia.  Patient denies urgency of urination.  Patient denies any fever or chills.  Patient has seen no visible blood in the urine.  Patient reports that he is already received his influenza immunization for the season back in October and he believes that it may have been at a different medical office. Past Medical History:  Diagnosis Date  . Low back pain with radiation   . Personal history of prostate cancer    Past Surgical History:  Procedure Laterality Date  . PROSTATE SURGERY     Social History   Tobacco Use  . Smoking status: Current Some Day Smoker    Types: Cigars  . Smokeless tobacco: Never Used  Substance Use Topics  . Alcohol use: Yes  . Drug use: Yes    Types:  Marijuana   Family History  Problem Relation Age of Onset  . Hypertension Mother   . Cancer Neg Hx   . Diabetes Neg Hx   . CVA Neg Hx   No Known Allergies   Review of Systems  Constitutional: Positive for fatigue. Negative for chills and fever.  Respiratory: Negative for cough and shortness of breath.   Cardiovascular: Negative for chest pain, palpitations and leg swelling.  Gastrointestinal: Negative for abdominal pain, blood in stool, constipation, diarrhea and nausea.  Endocrine: Negative for polydipsia and polyphagia.  Genitourinary: Positive for frequency. Negative for dysuria and hematuria.  Musculoskeletal: Positive for arthralgias and back pain.  Neurological: Positive for numbness. Negative for dizziness and headaches.  Hematological: Negative for adenopathy. Does not bruise/bleed easily.       Objective:   Physical Exam BP (!) 163/90 (BP Location: Left Arm, Patient Position: Sitting, Cuff Size: Normal)   Pulse 73   Temp 98.7 F (37.1 C) (Oral)   Resp 18   Ht 5\' 6"  (1.676 m)   Wt 147 lb (66.7 kg)   SpO2 98%   BMI 23.73 kg/m Nurse's notes and vital signs reviewed General-well-nourished well-developed but smaller framed older male who appears to be in no acute distress but patient cannot sit very long secondary to his back pain Neck-supple, no lymphadenopathy, no thyromegaly, no JVD Lungs-clear to auscultation bilaterally Cardiovascular-regular  rate and rhythm Abdomen-soft, patient with mild suprapubic discomfort to palpation, no rebound or guarding Back-patient with bilateral CVA tenderness right greater than left.  Patient with thoracolumbar paraspinous spasm.  Patient with lumbosacral discomfort to palpation and right SI joint tenderness Extremities-no edema     Assessment & Plan:  1. Essential hypertension Patient's blood pressure continues to remain elevated.  Patient's amlodipine will be increased to 10 mg daily.  Patient has recently filled 5 mg amlodipine  therefore he may take 2 of the 5 mg pills daily for now and then pick up a new prescription for 10 mg which has been sent to his pharmacy.  Patient is accompanied by a friend who states that they fill his pill planner and they will put on additional 5 mg pill daily into the pill planner to achieve the increased dose of blood pressure medication.  Patient and his friend were also told that this medication can cause increased peripheral edema and that this should be reported to me if this occurs.  Patient will have CMP in follow-up of long-term use of medications and patient will have urinalysis and follow-up of hypertension as well as patient with new onset urinary frequency. - POCT URINALYSIS DIP (CLINITEK) - Comprehensive metabolic panel - amLODipine (NORVASC) 10 MG tablet; Take 1 tablet (10 mg total) by mouth daily. To lower blood pressure  Dispense: 90 tablet; Refill: 1  2. History of prostate cancer Patient with history of prostate cancer and patient reports that he has had recent issues with increased urinary frequency and patient with CVA tenderness on exam.  Patient had urinalysis which did not show signs of infection but patient did have proteinuria.  Patient due to his urinary symptoms and increased back pain/CVA tenderness on exam will be placed on Septra DS for 10 days but patient should return or call if his symptoms continue or if he has any issues with the medication. - POCT URINALYSIS DIP (CLINITEK) - sulfamethoxazole-trimethoprim (BACTRIM DS,SEPTRA DS) 800-160 MG tablet; Take 1 tablet by mouth 2 (two) times daily for 10 days. For urinary frequency  Dispense: 20 tablet; Refill: 0  3. Urinary frequency Patient with complaint of recent increase in urinary frequency.  Patient will have CMP done in follow-up as well as urinalysis.  Patient's urinalysis did not show any leukocytes or nitrites.  Patient did have proteinuria.  Patient will be placed on Septra DS x10 days secondary to his urinary  symptoms and history of prostate cancer which increases his risk for UTI or prostatitis.  Patient should call or return of his symptoms worsen or do not improve with the use of antibiotic therapy - POCT URINALYSIS DIP (CLINITEK) - Comprehensive metabolic panel - sulfamethoxazole-trimethoprim (BACTRIM DS,SEPTRA DS) 800-160 MG tablet; Take 1 tablet by mouth 2 (two) times daily for 10 days. For urinary frequency  Dispense: 20 tablet; Refill: 0  4. Chronic kidney disease, stage 2 (mild) Patient had recent labs on 03/14/2018 and patient had mild increase in serum creatinine at 1.35.  Patient is to remain hydrated.  Patient is receiving an increase in his dose of amlodipine to better control his blood pressures and patient will have repeat creatinine as part of CMP - Comprehensive metabolic panel CMP Latest Ref Rng & Units 03/14/2018 05/06/2010 10/19/2007  Glucose 65 - 99 mg/dL 75 94 95  BUN 8 - 27 mg/dL 14 13 13   Creatinine 0.76 - 1.27 mg/dL 1.35(H) 1.39 1.14  Sodium 134 - 144 mmol/L 141 139 142  Potassium 3.5 -  5.2 mmol/L 4.5 5.1 4.2  Chloride 96 - 106 mmol/L 101 101 102  CO2 20 - 29 mmol/L 24 25 25   Calcium 8.6 - 10.2 mg/dL 9.5 10.4 10.4  Total Protein 6.0 - 8.5 g/dL 7.2 8.4(H) 8.7(H)  Total Bilirubin 0.0 - 1.2 mg/dL 0.5 0.7 1.3(H)  Alkaline Phos 39 - 117 IU/L 51 40 47  AST 0 - 40 IU/L 12 17 16   ALT 0 - 44 IU/L 8 13 13    5. Chronic bilateral low back pain with right-sided sciatica Patient with chronic low back pain and is currently on Mobic and gabapentin.  Patient is now seeing a pain management doctor at [facility] medical clinic and has upcoming follow-up appointment.  6. Encounter for long-term (current) use of high-risk medication Patient with long-term use of high-risk medication and patient will have CMP at today's visit in follow-up - Comprehensive metabolic panel  An After Visit Summary was printed and given to the patient. Allergies as of 04/11/2018   No Known Allergies       Medication List       Accurate as of April 11, 2018  9:57 AM. Always use your most recent med list.        amLODipine 10 MG tablet Commonly known as:  NORVASC Take 1 tablet (10 mg total) by mouth daily. To lower blood pressure   gabapentin 300 MG capsule Commonly known as:  NEURONTIN Take 1 capsule (300 mg total) by mouth 3 (three) times daily.   meloxicam 7.5 MG tablet Commonly known as:  MOBIC Take 2 tablets (15 mg total) by mouth daily.   sulfamethoxazole-trimethoprim 800-160 MG tablet Commonly known as:  BACTRIM DS,SEPTRA DS Take 1 tablet by mouth 2 (two) times daily for 10 days. For urinary frequency       Return in about 4 months (around 08/11/2018) for BP recheck Luke in 4 weeks;Marland Kitchen

## 2018-04-11 NOTE — Telephone Encounter (Signed)
Message created in error

## 2018-04-11 NOTE — Patient Instructions (Addendum)
Dose of blood pressure medication, amlodipine is being increased to 10 mg. Since you recently filled the 5 mg dose you can take 2 of the 5 mg to make 10 mg once per day but then get the 10 mg filled to start once per day.

## 2018-04-12 LAB — COMPREHENSIVE METABOLIC PANEL WITH GFR
ALT: 7 IU/L (ref 0–44)
AST: 11 IU/L (ref 0–40)
Albumin/Globulin Ratio: 1.7 (ref 1.2–2.2)
Albumin: 4.6 g/dL (ref 3.6–4.8)
Alkaline Phosphatase: 47 IU/L (ref 39–117)
BUN/Creatinine Ratio: 12 (ref 10–24)
BUN: 13 mg/dL (ref 8–27)
Bilirubin Total: 0.6 mg/dL (ref 0.0–1.2)
CO2: 26 mmol/L (ref 20–29)
Calcium: 9.6 mg/dL (ref 8.6–10.2)
Chloride: 103 mmol/L (ref 96–106)
Creatinine, Ser: 1.12 mg/dL (ref 0.76–1.27)
GFR calc Af Amer: 79 mL/min/1.73
GFR calc non Af Amer: 68 mL/min/1.73
Globulin, Total: 2.7 g/dL (ref 1.5–4.5)
Glucose: 78 mg/dL (ref 65–99)
Potassium: 3.9 mmol/L (ref 3.5–5.2)
Sodium: 145 mmol/L — ABNORMAL HIGH (ref 134–144)
Total Protein: 7.3 g/dL (ref 6.0–8.5)

## 2018-04-14 ENCOUNTER — Telehealth: Payer: Self-pay | Admitting: *Deleted

## 2018-04-14 NOTE — Telephone Encounter (Signed)
-----   Message from Antony Blackbird, MD sent at 04/13/2018 11:17 PM EST ----- Notify patient of normal CMP (sodium 145 and normal 134-144)

## 2018-04-14 NOTE — Telephone Encounter (Signed)
Patient verified DOB Patient is aware of CMP being normal. No further questions.

## 2018-04-18 DIAGNOSIS — M5136 Other intervertebral disc degeneration, lumbar region: Secondary | ICD-10-CM | POA: Diagnosis not present

## 2018-05-09 ENCOUNTER — Ambulatory Visit: Payer: Medicare HMO | Admitting: Pharmacist

## 2018-05-10 ENCOUNTER — Ambulatory Visit: Payer: Medicare HMO | Admitting: Pharmacist

## 2018-05-10 NOTE — Progress Notes (Deleted)
   S:    PCP: Dr. Chapman Fitch  Patient arrives ***. Presents to the clinic for hypertension management. Patient was referred by Dr. Chapman Fitch on 04/11/18.  BP at that visit 163/90. Dr. Chapman Fitch increased amlodipine.   Patient {Actions; denies-reports:120008} adherence with medications.  Current BP Medications include:  Amlodipine 10 mg  Antihypertensives tried in the past include: amlodipine 5 mg ***meloxicam  Dietary habits include: *** Exercise habits include:*** Family / Social history: ***  ASCVD risk factors include:***  Home BP readings: ***    O:  Physical Exam   ROS  Last 3 Office BP readings: BP Readings from Last 3 Encounters:  04/11/18 (!) 163/90  03/14/18 (!) 160/100  01/26/18 140/70    BMET    Component Value Date/Time   NA 145 (H) 04/11/2018 0906   K 3.9 04/11/2018 0906   CL 103 04/11/2018 0906   CO2 26 04/11/2018 0906   GLUCOSE 78 04/11/2018 0906   GLUCOSE 94 05/06/2010 2038   BUN 13 04/11/2018 0906   CREATININE 1.12 04/11/2018 0906   CALCIUM 9.6 04/11/2018 0906   GFRNONAA 68 04/11/2018 0906   GFRAA 79 04/11/2018 0906    Renal function: CrCl cannot be calculated (Patient's most recent lab result is older than the maximum 21 days allowed.).  Clinical ASCVD: {YES/NO:21197} The ASCVD Risk score Mikey Bussing DC Jr., et al., 2013) failed to calculate for the following reasons:   The valid total cholesterol range is 130 to 320 mg/dL   A/P: Hypertension longstanding/newly diagnosed currently *** on current medications. BP Goal = *** mmHg. Patient {Is/is not:9024} adherent with current medications.  -{Meds adjust:18428} ***.  -F/u labs ordered - *** -Counseled on lifestyle modifications for blood pressure control including reduced dietary sodium, increased exercise, adequate sleep  Results reviewed and written information provided.   Total time in face-to-face counseling *** minutes.   F/U Clinic Visit in ***.  Patient seen with ***

## 2018-05-11 NOTE — Progress Notes (Signed)
   S:    PCP: Dr. Chapman Fitch  Patient arrives in good spirits. Presents to the clinic for hypertension management. Patient was referred by Dr. Chapman Fitch on 04/11/18.  BP at that visit 163/90. Dr. Chapman Fitch increased amlodipine.   Patient denies adherence with medications.  Current BP Medications include:  Amlodipine 10 mg - hasn't taken in 2 weeks  Antihypertensives tried in the past include: amlodipine 5 mg  Dietary habits include: limits salt; 3 cups of coffee in the morning Exercise habits: "moving all day" Family / Social history: HTN (mother); current some day smoker (cigars); occasionally uses alcohol (1-2 drinks)  Home BP readings: not taking  O:  L arm after 5 minutes: 132/78  Last 3 Office BP readings: BP Readings from Last 3 Encounters:  05/12/18 132/78  04/11/18 (!) 163/90  03/14/18 (!) 160/100   BMET    Component Value Date/Time   NA 145 (H) 04/11/2018 0906   K 3.9 04/11/2018 0906   CL 103 04/11/2018 0906   CO2 26 04/11/2018 0906   GLUCOSE 78 04/11/2018 0906   GLUCOSE 94 05/06/2010 2038   BUN 13 04/11/2018 0906   CREATININE 1.12 04/11/2018 0906   CALCIUM 9.6 04/11/2018 0906   GFRNONAA 68 04/11/2018 0906   GFRAA 79 04/11/2018 0906   Renal function: CrCl cannot be calculated (Patient's most recent lab result is older than the maximum 21 days allowed.).  Clinical ASCVD: No  The ASCVD Risk score Mikey Bussing DC Jr., et al., 2013) failed to calculate for the following reasons:   The valid total cholesterol range is 130 to 320 mg/dL  A/P: Hypertension longstanding currently uncontrolled on current medications. BP Goal <130/80 mmHg. Patient is not adherent with amlodipine - will stop and try lisinopril given CKD/proteinuria and lisinopril provides protective renal effects.   -Start taking lisinopril 5mg  once daily. -Stop taking amlodipine. -F/u labs ordered - recommend BMP in 4-6 weeks. -Counseled on lifestyle modifications for blood pressure control including reduced dietary  sodium, increased exercise, adequate sleep  Results reviewed and written information provided. Total time in face-to-face counseling 15 minutes.   F/U Clinic Visit in 2 weeks with clinical pharmacist.    Patient seen with:  Beckey Rutter, PharmD Candidate  [place] [employer]  Class of 2022  Benard Halsted, PharmD, [place] [phone]

## 2018-05-12 ENCOUNTER — Ambulatory Visit: Payer: Medicare HMO | Attending: Family Medicine | Admitting: Pharmacist

## 2018-05-12 ENCOUNTER — Encounter: Payer: Self-pay | Admitting: Pharmacist

## 2018-05-12 VITALS — BP 132/78

## 2018-05-12 DIAGNOSIS — I10 Essential (primary) hypertension: Secondary | ICD-10-CM

## 2018-05-12 MED ORDER — LISINOPRIL 5 MG PO TABS: 5.0000 mg | ORAL_TABLET | Freq: Every day | ORAL | 2 refills | Status: DC

## 2018-05-12 NOTE — Patient Instructions (Signed)
Thank you for coming to see [place] today.   Blood pressure today is improving.  Start taking lisinopril 5 mg daily. Stop amlodipine.  Limiting salt and caffeine, as well as exercising as able for at least 30 minutes for 5 days out of the week, can also help you lower your blood pressure.   Take your blood pressure at home if you are able. Please write down these numbers and bring them to your visits.  If you have any questions about medications, please call me [phone].  Lurena Joiner

## 2018-05-19 DIAGNOSIS — Z79899 Other long term (current) drug therapy: Secondary | ICD-10-CM | POA: Diagnosis not present

## 2018-05-19 DIAGNOSIS — M5136 Other intervertebral disc degeneration, lumbar region: Secondary | ICD-10-CM | POA: Diagnosis not present

## 2018-05-26 ENCOUNTER — Ambulatory Visit: Payer: Medicare HMO | Attending: Family Medicine | Admitting: Pharmacist

## 2018-05-26 ENCOUNTER — Encounter: Payer: Self-pay | Admitting: Pharmacist

## 2018-05-26 VITALS — BP 166/100 | HR 84

## 2018-05-26 DIAGNOSIS — N189 Chronic kidney disease, unspecified: Secondary | ICD-10-CM | POA: Insufficient documentation

## 2018-05-26 DIAGNOSIS — Z7901 Long term (current) use of anticoagulants: Secondary | ICD-10-CM | POA: Insufficient documentation

## 2018-05-26 DIAGNOSIS — I10 Essential (primary) hypertension: Secondary | ICD-10-CM

## 2018-05-26 DIAGNOSIS — F17290 Nicotine dependence, other tobacco product, uncomplicated: Secondary | ICD-10-CM | POA: Insufficient documentation

## 2018-05-26 DIAGNOSIS — R809 Proteinuria, unspecified: Secondary | ICD-10-CM | POA: Insufficient documentation

## 2018-05-26 DIAGNOSIS — I129 Hypertensive chronic kidney disease with stage 1 through stage 4 chronic kidney disease, or unspecified chronic kidney disease: Secondary | ICD-10-CM | POA: Insufficient documentation

## 2018-05-26 MED ORDER — LISINOPRIL 20 MG PO TABS: 20.0000 mg | ORAL_TABLET | Freq: Every day | ORAL | 0 refills | Status: DC

## 2018-05-26 NOTE — Progress Notes (Signed)
   S:    PCP: Dr. Fulp  Patient arrives in good spirits. Presents to the clinic for hypertension management. Patient was referred by Dr. Fulp on 04/11/18.  I last saw the pt on 05/12/18 and stopped amlodipine d/t non-adherence. Started lisinopril (per PCP, pt has hx of CKD/proteinuria).   Patient reports adherence with medications. Endorses HA that started Monday. Denies chest pain, dyspnea, or blurred vision - denies BLE edema.   Current BP Medications include:  Lisinopril 5 mg daily   Antihypertensives tried in the past include: amlodipine 5 mg  Dietary habits include: limits salt; 3 cups of coffee in the morning Exercise habits: "moving all day" Family / Social history: HTN (mother); current some day smoker (cigars); occasionally uses alcohol (1-2 drinks)   Home BP readings: not taking  O:  L arm after 5 minutes: 166/100, HR 84   Last 3 Office BP readings: BP Readings from Last 3 Encounters:  05/12/18 132/78  04/11/18 (!) 163/90  03/14/18 (!) 160/100   BMET    Component Value Date/Time   NA 145 (H) 04/11/2018 0906   K 3.9 04/11/2018 0906   CL 103 04/11/2018 0906   CO2 26 04/11/2018 0906   GLUCOSE 78 04/11/2018 0906   GLUCOSE 94 05/06/2010 2038   BUN 13 04/11/2018 0906   CREATININE 1.12 04/11/2018 0906   CALCIUM 9.6 04/11/2018 0906   GFRNONAA 68 04/11/2018 0906   GFRAA 79 04/11/2018 0906   Renal function: CrCl cannot be calculated (Patient's most recent lab result is older than the maximum 21 days allowed.).  Clinical ASCVD: No  The ASCVD Risk score (Goff DC Jr., et al., 2013) failed to calculate for the following reasons:   The valid total cholesterol range is 130 to 320 mg/dL  A/P: Hypertension longstanding currently uncontrolled on current medications. BP Goal <130/80 mmHg. Patient reports adherence to lisinopril.   -Increase lisinopril to 20 mg daily -CMP14 + eGFR -Counseled on lifestyle modifications for blood pressure control including reduced dietary  sodium, increased exercise, adequate sleep  Results reviewed and written information provided. Total time in face-to-face counseling 15 minutes.   F/U Clinic Visit in 3 weeks with clinical pharmacist.    Patient seen with:  Jennie Wilson, PharmD Candidate  [employer]  Class of 2022  Luke Van Ausdall, PharmD, CPP Clinical Pharmacist [facility] [phone]         

## 2018-05-26 NOTE — Patient Instructions (Addendum)
Thank you for coming to see [place] today.   Blood pressure today is elevated.  We are increasing lisinopril to 20 mg daily.   Limiting salt and caffeine, as well as exercising as able for at least 30 minutes for 5 days out of the week, can also help you lower your blood pressure.  Take your blood pressure at home if you are able. Please write down these numbers and bring them to your visits.  If you have any questions about medications, please call me [phone].  Lurena Joiner

## 2018-05-27 LAB — CMP14+EGFR
ALT: 12 IU/L (ref 0–44)
AST: 18 IU/L (ref 0–40)
Albumin/Globulin Ratio: 1.5 (ref 1.2–2.2)
Albumin: 4.7 g/dL (ref 3.8–4.8)
Alkaline Phosphatase: 53 IU/L (ref 39–117)
BUN/Creatinine Ratio: 12 (ref 10–24)
BUN: 16 mg/dL (ref 8–27)
Bilirubin Total: 0.8 mg/dL (ref 0.0–1.2)
CO2: 25 mmol/L (ref 20–29)
Calcium: 10 mg/dL (ref 8.6–10.2)
Chloride: 98 mmol/L (ref 96–106)
Creatinine, Ser: 1.38 mg/dL — ABNORMAL HIGH (ref 0.76–1.27)
GFR calc Af Amer: 61 mL/min/1.73
GFR calc non Af Amer: 53 mL/min/1.73 — ABNORMAL LOW
Globulin, Total: 3.2 g/dL (ref 1.5–4.5)
Glucose: 76 mg/dL (ref 65–99)
Potassium: 4.1 mmol/L (ref 3.5–5.2)
Sodium: 142 mmol/L (ref 134–144)
Total Protein: 7.9 g/dL (ref 6.0–8.5)

## 2018-05-30 ENCOUNTER — Telehealth: Payer: Self-pay | Admitting: *Deleted

## 2018-05-30 DIAGNOSIS — M2560 Stiffness of unspecified joint, not elsewhere classified: Secondary | ICD-10-CM | POA: Diagnosis not present

## 2018-05-30 DIAGNOSIS — M2550 Pain in unspecified joint: Secondary | ICD-10-CM | POA: Diagnosis not present

## 2018-05-30 DIAGNOSIS — M5136 Other intervertebral disc degeneration, lumbar region: Secondary | ICD-10-CM | POA: Diagnosis not present

## 2018-05-30 DIAGNOSIS — M545 Low back pain: Secondary | ICD-10-CM | POA: Diagnosis not present

## 2018-05-30 NOTE — Telephone Encounter (Signed)
-----   Message from Antony Blackbird, MD sent at 05/28/2018  5:02 PM EST ----- Please notify patient that his CMP showed a mild increase in his creatinine at 1.38.  Patient should make sure that he drinks water/remains hydrated on a daily basis as well as make sure that his blood pressures controlled. (Patient is listed as a male and I am not sure if this lab was done with parameters/lab ranges for male as the creatinine range for men and women is usually slightly different).  Complete metabolic panel was otherwise normal

## 2018-05-30 NOTE — Telephone Encounter (Signed)
Patient verified DOB Patient is aware of needing to stay hydrated and taking BP medication as prescribed to protect kidneys. Patient is aware of a recheck being completed at the next visit. MA also has changed patients demographics to reflect MALE orientation.

## 2018-06-01 DIAGNOSIS — M2550 Pain in unspecified joint: Secondary | ICD-10-CM | POA: Diagnosis not present

## 2018-06-01 DIAGNOSIS — M2560 Stiffness of unspecified joint, not elsewhere classified: Secondary | ICD-10-CM | POA: Diagnosis not present

## 2018-06-01 DIAGNOSIS — M5136 Other intervertebral disc degeneration, lumbar region: Secondary | ICD-10-CM | POA: Diagnosis not present

## 2018-06-01 DIAGNOSIS — M545 Low back pain: Secondary | ICD-10-CM | POA: Diagnosis not present

## 2018-06-07 ENCOUNTER — Other Ambulatory Visit: Payer: Self-pay | Admitting: Pharmacist

## 2018-06-07 DIAGNOSIS — M545 Low back pain: Secondary | ICD-10-CM | POA: Diagnosis not present

## 2018-06-07 DIAGNOSIS — M5136 Other intervertebral disc degeneration, lumbar region: Secondary | ICD-10-CM | POA: Diagnosis not present

## 2018-06-07 DIAGNOSIS — M2550 Pain in unspecified joint: Secondary | ICD-10-CM | POA: Diagnosis not present

## 2018-06-07 DIAGNOSIS — M2560 Stiffness of unspecified joint, not elsewhere classified: Secondary | ICD-10-CM | POA: Diagnosis not present

## 2018-06-07 DIAGNOSIS — I10 Essential (primary) hypertension: Secondary | ICD-10-CM

## 2018-06-07 MED ORDER — LISINOPRIL 20 MG PO TABS: 20.0000 mg | ORAL_TABLET | Freq: Every day | ORAL | 0 refills | Status: DC

## 2018-06-15 ENCOUNTER — Encounter: Payer: Self-pay | Admitting: Pharmacist

## 2018-06-15 ENCOUNTER — Ambulatory Visit: Payer: Medicare HMO | Attending: Family Medicine | Admitting: Pharmacist

## 2018-06-15 VITALS — BP 153/89 | HR 83

## 2018-06-15 DIAGNOSIS — I10 Essential (primary) hypertension: Secondary | ICD-10-CM | POA: Diagnosis not present

## 2018-06-15 MED ORDER — LISINOPRIL 40 MG PO TABS: 40.0000 mg | ORAL_TABLET | Freq: Every day | ORAL | 0 refills | Status: DC

## 2018-06-15 NOTE — Patient Instructions (Signed)
Thank you for coming to see [place] today.   Blood pressure today is improving.   Start taking lisinopril 40 mg daily. You'll take 1 tablet a day.   Limiting salt and caffeine, as well as exercising as able for at least 30 minutes for 5 days out of the week, can also help you lower your blood pressure.  Take your blood pressure at home if you are able. Please write down these numbers and bring them to your visits.  If you have any questions about medications, please call me [phone].  Lurena Joiner

## 2018-06-15 NOTE — Progress Notes (Signed)
   S:    PCP: Dr. Chapman Fitch  Patient arrives in good spirits. Presents to the clinic for hypertension management. Patient was referred by Dr. Chapman Fitch on 04/11/18. I last saw him 05/26/18 - BP at that visit 166/100.   Patient reports adherence with medications. Reports resolution of HA that was present at last visit. Denies chest pain, dyspnea, or blurred vision. Denies BLE.  Current BP Medications include:  Lisinopril 20 mg   Dietary habits include: limits salt, reports drinking coffee throughout the day  Exercise habits include: "moving all day" Family / Social history:  - FHx: HTN (mother) - Tobacco: current some day smoker; smokes cigars - Alcohol: occasional use  Home BP readings: not taking  O:  L arm after 5 minutes: 153/89, HR 83  Last 3 Office BP readings: BP Readings from Last 3 Encounters:  06/15/18 (!) 153/89  05/26/18 (!) 166/100  05/12/18 132/78   BMET    Component Value Date/Time   NA 142 05/26/2018 1155   K 4.1 05/26/2018 1155   CL 98 05/26/2018 1155   CO2 25 05/26/2018 1155   GLUCOSE 76 05/26/2018 1155   GLUCOSE 94 05/06/2010 2038   BUN 16 05/26/2018 1155   CREATININE 1.38 (H) 05/26/2018 1155   CALCIUM 10.0 05/26/2018 1155   GFRNONAA 53 (L) 05/26/2018 1155   GFRAA 61 05/26/2018 1155   Renal function: CrCl cannot be calculated (Patient's most recent lab result is older than the maximum 21 days allowed.).  Clinical ASCVD: No  The ASCVD Risk score Mikey Bussing DC Jr., et al., 2013) failed to calculate for the following reasons:   The valid total cholesterol range is 130 to 320 mg/dL   A/P: Hypertension longstanding currently uncontrolled on current medications. BP Goal = <130/80 mmHg. Patient is adherent with current medications. Will increase lisinopril to 40 mg daily. Of note patient's creatinine is up 23% from 1.12 to 1.38 per most recent CMP. No need for discontinuation of lisinopril at this time, however, will continue to monitor. Will place orders for future  CMP. -Increased dose of lisinopril to 40 mg daily.   -F/u labs ordered - CMP14 +GFR future (07/14/18). -Counseled on lifestyle modifications for blood pressure control including reduced dietary sodium, increased exercise, adequate sleep  Results reviewed and written information provided. Total time in face-to-face counseling 15 minutes.   F/U Clinic Visit in 1 month with PCP.    Patient seen with: Beckey Rutter, PharmD Candidate  [place] [employer] [facility]  Class of 2022  Benard Halsted, PharmD, [place] [phone]

## 2018-06-16 ENCOUNTER — Encounter: Payer: Self-pay | Admitting: Pharmacist

## 2018-06-16 DIAGNOSIS — G894 Chronic pain syndrome: Secondary | ICD-10-CM | POA: Diagnosis not present

## 2018-06-16 DIAGNOSIS — Z79899 Other long term (current) drug therapy: Secondary | ICD-10-CM | POA: Diagnosis not present

## 2018-06-16 DIAGNOSIS — M5136 Other intervertebral disc degeneration, lumbar region: Secondary | ICD-10-CM | POA: Diagnosis not present

## 2018-06-20 ENCOUNTER — Ambulatory Visit: Payer: Medicare HMO | Admitting: Family Medicine

## 2018-07-14 ENCOUNTER — Encounter: Payer: Self-pay | Admitting: Family Medicine

## 2018-07-14 ENCOUNTER — Other Ambulatory Visit: Payer: Self-pay

## 2018-07-14 ENCOUNTER — Ambulatory Visit: Payer: Medicare HMO | Attending: Family Medicine | Admitting: Family Medicine

## 2018-07-14 DIAGNOSIS — Z79899 Other long term (current) drug therapy: Secondary | ICD-10-CM | POA: Diagnosis not present

## 2018-07-14 DIAGNOSIS — M5136 Other intervertebral disc degeneration, lumbar region: Secondary | ICD-10-CM | POA: Diagnosis not present

## 2018-07-14 DIAGNOSIS — I10 Essential (primary) hypertension: Secondary | ICD-10-CM | POA: Diagnosis not present

## 2018-07-14 DIAGNOSIS — I129 Hypertensive chronic kidney disease with stage 1 through stage 4 chronic kidney disease, or unspecified chronic kidney disease: Secondary | ICD-10-CM | POA: Insufficient documentation

## 2018-07-14 DIAGNOSIS — G894 Chronic pain syndrome: Secondary | ICD-10-CM | POA: Diagnosis not present

## 2018-07-14 DIAGNOSIS — N182 Chronic kidney disease, stage 2 (mild): Secondary | ICD-10-CM

## 2018-07-14 NOTE — Progress Notes (Signed)
Patient verified DOB Patient has taken medication today and patient has eaten today. Patient denies a HA, blurred vision and dizziness. Patient complains of Bilateral leg chronic pain, scaled at a 9 currently. Patient feels relief when walking.

## 2018-07-14 NOTE — Progress Notes (Signed)
Established Patient Office Visit  Subjective:  Patient ID: Timothy [name], male    DOB: 1950-09-03  Age: 68 y.o. MRN: [id number]  CC:  Chief Complaint  Patient presents with  . Hypertension    HPI Timothy [name] presents for follow-up of hypertension.  Patient is currently taking lisinopril 40 mg once daily.  Patient denies any headaches or dizziness related to his blood pressure.  Patient has had no issues with the medication and denies any dry cough associated with the use of lisinopril.  Patient reports that he was seen by his pain management doctor earlier today and his blood pressure at that time was 160/63.  Patient denies chest pain or shortness of breath.  Patient feels that he is doing well on his current medication.  Past Medical History:  Diagnosis Date  . Low back pain with radiation   . Personal history of prostate cancer     Past Surgical History:  Procedure Laterality Date  . PROSTATE SURGERY      Family History  Problem Relation Age of Onset  . Hypertension Mother   . Cancer Neg Hx   . Diabetes Neg Hx   . CVA Neg Hx     Social History   Tobacco Use  . Smoking status: Current Some Day Smoker    Types: Cigars  . Smokeless tobacco: Never Used  Substance Use Topics  . Alcohol use: Yes  . Drug use: Yes    Types: Marijuana    Outpatient Medications Prior to Visit  Medication Sig Dispense Refill  . gabapentin (NEURONTIN) 300 MG capsule Take 1 capsule (300 mg total) by mouth 3 (three) times daily. 90 capsule 3  . lisinopril (PRINIVIL,ZESTRIL) 40 MG tablet Take 1 tablet (40 mg total) by mouth daily. 90 tablet 0  . meloxicam (MOBIC) 7.5 MG tablet Take 2 tablets (15 mg total) by mouth daily. 60 tablet 3   No facility-administered medications prior to visit.     No Known Allergies  ROS Review of Systems  Constitutional: Negative for chills, fatigue and fever.  Respiratory: Negative for cough and shortness of breath.   Cardiovascular: Negative for  chest pain, palpitations and leg swelling.  Gastrointestinal: Negative for abdominal pain, constipation and diarrhea.  Genitourinary: Negative for dysuria and frequency.  Musculoskeletal: Positive for back pain and gait problem.  Neurological: Negative for dizziness and headaches.  Hematological: Negative for adenopathy. Does not bruise/bleed easily.      Objective:    Physical Exam No physical examination and no vital signs done at today's visit as this visit was done as a telehealth encounter There were no vitals taken for this visit. Wt Readings from Last 3 Encounters:  04/11/18 147 lb (66.7 kg)  03/14/18 145 lb 3.2 oz (65.9 kg)  01/26/18 144 lb 3.2 oz (65.4 kg)     Health Maintenance Due  Topic Date Due  . Hepatitis C Screening  [birth year]  . COLONOSCOPY  11/05/2013    There are no preventive care reminders to display for this patient.  Lab Results  Component Value Date   TSH 0.808 10/19/2007   Lab Results  Component Value Date   WBC 5.5 03/14/2018   HGB 13.6 03/14/2018   HCT 39.7 03/14/2018   MCV 90 03/14/2018   PLT 171 03/14/2018   Lab Results  Component Value Date   NA 142 05/26/2018   K 4.1 05/26/2018   CO2 25 05/26/2018   GLUCOSE 76 05/26/2018   BUN 16 05/26/2018  CREATININE 1.38 (H) 05/26/2018   BILITOT 0.8 05/26/2018   ALKPHOS 53 05/26/2018   AST 18 05/26/2018   ALT 12 05/26/2018   PROT 7.9 05/26/2018   ALBUMIN 4.7 05/26/2018   CALCIUM 10.0 05/26/2018   Lab Results  Component Value Date   CHOL 125 03/14/2018   Lab Results  Component Value Date   HDL 43 03/14/2018   Lab Results  Component Value Date   LDLCALC 67 03/14/2018   Lab Results  Component Value Date   TRIG 77 03/14/2018   Lab Results  Component Value Date   CHOLHDL 2.9 03/14/2018   No results found for: HGBA1C    Assessment & Plan:  1. Essential hypertension Patient with essential hypertension with improvement in blood pressure to 160/63 per patient's report.   Patient will continue use of lisinopril.  Discussed possible addition of hydrochlorothiazide but patient wishes to remain on his current medication at this time without additional medication.   2. Benign hypertension with chronic kidney disease, stage II Patient with most recent creatinine of 1.38 and patient was scheduled to have repeat blood work however this has not yet been done.  Patient was asked to return to clinic in approximately 4 weeks and hopefully at that time we will resume seeing patients in person and at 4 weeks he should have recheck of blood work, lab orders already pending and patient should schedule 6-week office visit for blood pressure follow-up.    Follow-up: Return in about 6 weeks (around 08/25/2018) for HTN- 4 week lab visit and 6 week office.   Antony Blackbird, MD

## 2018-08-09 ENCOUNTER — Other Ambulatory Visit: Payer: Self-pay | Admitting: Family Medicine

## 2018-08-09 DIAGNOSIS — Z79899 Other long term (current) drug therapy: Secondary | ICD-10-CM | POA: Diagnosis not present

## 2018-08-09 DIAGNOSIS — G894 Chronic pain syndrome: Secondary | ICD-10-CM | POA: Diagnosis not present

## 2018-08-09 DIAGNOSIS — M5136 Other intervertebral disc degeneration, lumbar region: Secondary | ICD-10-CM | POA: Diagnosis not present

## 2018-08-11 ENCOUNTER — Encounter: Payer: Self-pay | Admitting: Family Medicine

## 2018-08-11 ENCOUNTER — Ambulatory Visit: Payer: Medicare HMO | Attending: Family Medicine | Admitting: Family Medicine

## 2018-08-11 ENCOUNTER — Other Ambulatory Visit: Payer: Self-pay

## 2018-08-11 DIAGNOSIS — I10 Essential (primary) hypertension: Secondary | ICD-10-CM | POA: Diagnosis not present

## 2018-08-11 DIAGNOSIS — N182 Chronic kidney disease, stage 2 (mild): Secondary | ICD-10-CM | POA: Diagnosis not present

## 2018-08-11 MED ORDER — LISINOPRIL 40 MG PO TABS: 40.0000 mg | ORAL_TABLET | Freq: Every day | ORAL | 1 refills | Status: DC

## 2018-08-11 NOTE — Progress Notes (Signed)
Virtual Visit via Telephone Note  I connected with EVERARDO VORIS on 08/11/18 at  8:30 AM EDT by telephone and verified that I am speaking with the correct person using two identifiers.   I discussed the limitations, risks, security and privacy concerns of performing an evaluation and management service by telephone and the availability of in person appointments. I also discussed with the patient that there may be a patient responsible charge related to this service. The patient expressed understanding and agreed to proceed.  Patient location: Home Provider location : Office Others on call: Emilio Aspen, CMA initiated telephone visit   History of Present Illness:      68 yo male seen in follow-up of Hypertension and mild, stage 2 chronic kidney disease (CKD). He reports that he is doing well and being compliant with his medications. He denies any headaches or dizziness related to his blood pressure. He saw his pain management doctor yesterday and states that his blood pressure was checked there and he was told that his BP was good but he does not recall the actual numbers. He has had no cough associated with lisinopril use. Overall, he thinks he is doing pretty good. He is trying to stay hydrated and avoid use of NSAID's after being made aware of issues with chronic kidney disease on lab work.   Past Medical History:  Diagnosis Date  . Chronic kidney disease (CKD), stage II (mild)   . Essential hypertension   . Low back pain with radiation   . Personal history of prostate cancer    Past Surgical History:  Procedure Laterality Date  . PROSTATE SURGERY     Family History  Problem Relation Age of Onset  . Hypertension Mother   . Cancer Neg Hx   . Diabetes Neg Hx   . CVA Neg Hx    Social History   Tobacco Use  . Smoking status: Current Some Day Smoker    Types: Cigars  . Smokeless tobacco: Never Used  Substance Use Topics  . Alcohol use: Yes  . Drug use: Yes    Types:  Marijuana   No Known Allergies   Review of Systems  Constitutional: Negative for chills, fever and malaise/fatigue.  HENT: Negative for congestion and sore throat.   Respiratory: Negative for cough and shortness of breath.   Cardiovascular: Negative for chest pain and palpitations.  Gastrointestinal: Negative for abdominal pain and nausea.  Genitourinary: Negative for flank pain and hematuria.  Musculoskeletal: Positive for back pain. Negative for falls.  Neurological: Negative for dizziness and headaches.     Observations/Objective: No Vital signs or physical exam as visit was done via telephone  Assessment and Plan: 1. Essential hypertension BP has been well controlled per patient. He will continue his current lisinopril 40 mg once per day and DASH diet encouraged. - lisinopril (ZESTRIL) 40 MG tablet; Take 1 tablet (40 mg total) by mouth daily.  Dispense: 90 tablet; Refill: 1  2. Chronic kidney disease, stage 2 (mild) Patient reminded to return for lab visit in 4-6 weeks or once restrictions are eased regarding COVID-19, will check BMP and urine creatinine/albumin ratio in follow-up of CKD. Continue to remain well hydrated and avoid NSAID's - lisinopril (ZESTRIL) 40 MG tablet; Take 1 tablet (40 mg total) by mouth daily.  Dispense: 90 tablet; Refill: 1  Follow Up Instructions:Return in about 2 months (around 10/11/2018) for HTN- 2-3 months for labs and follow-up.    I discussed the assessment and treatment plan  with the patient. The patient was provided an opportunity to ask questions and all were answered. The patient agreed with the plan and demonstrated an understanding of the instructions.   The patient was advised to call back or seek an in-person evaluation if the symptoms worsen or if the condition fails to improve as anticipated.  I provided 8 minutes of non-face-to-face time during this encounter.   Antony Blackbird, MD

## 2018-08-11 NOTE — Progress Notes (Signed)
Follow up for blood pressure and he stated when he went to his pain specialist yesterday, they told him his blood pressure was good.   Med refills.

## 2018-12-14 ENCOUNTER — Other Ambulatory Visit: Payer: Self-pay | Admitting: Family Medicine

## 2018-12-14 DIAGNOSIS — I10 Essential (primary) hypertension: Secondary | ICD-10-CM

## 2018-12-14 DIAGNOSIS — N182 Chronic kidney disease, stage 2 (mild): Secondary | ICD-10-CM

## 2018-12-14 MED ORDER — LISINOPRIL 40 MG PO TABS: 40.0000 mg | ORAL_TABLET | Freq: Every day | ORAL | 0 refills | Status: DC

## 2018-12-14 NOTE — Telephone Encounter (Signed)
1) Medication(s) Requested (by name): °lisinopril ° °2) Pharmacy of Choice: ° °[facility] - [place],  - [address] ° °3) Special Requests: ° ° °Approved medications will be sent to the pharmacy, we will reach out if there is an issue. ° °Requests made after 3pm may not be addressed until the following business day! ° °If a patient is unsure of the name of the medication(s) please note and ask patient to call back when they are able to provide all info, do not send to responsible party until all information is available! ° °

## 2018-12-14 NOTE — Telephone Encounter (Signed)
Medication refilled by provider today

## 2018-12-21 ENCOUNTER — Ambulatory Visit: Payer: Medicare HMO | Admitting: Family Medicine

## 2018-12-29 ENCOUNTER — Ambulatory Visit: Payer: Medicare HMO | Attending: Family Medicine | Admitting: Physician Assistant

## 2018-12-29 ENCOUNTER — Other Ambulatory Visit: Payer: Self-pay

## 2018-12-29 DIAGNOSIS — G8929 Other chronic pain: Secondary | ICD-10-CM

## 2018-12-29 DIAGNOSIS — Z13220 Encounter for screening for lipoid disorders: Secondary | ICD-10-CM

## 2018-12-29 DIAGNOSIS — M5441 Lumbago with sciatica, right side: Secondary | ICD-10-CM

## 2018-12-29 DIAGNOSIS — N182 Chronic kidney disease, stage 2 (mild): Secondary | ICD-10-CM | POA: Diagnosis not present

## 2018-12-29 DIAGNOSIS — I10 Essential (primary) hypertension: Secondary | ICD-10-CM

## 2018-12-29 MED ORDER — LISINOPRIL 40 MG PO TABS: 40.0000 mg | ORAL_TABLET | Freq: Every day | ORAL | 1 refills | Status: DC

## 2018-12-29 MED ORDER — GABAPENTIN 300 MG PO CAPS: 300.0000 mg | ORAL_CAPSULE | Freq: Three times a day (TID) | ORAL | 3 refills | Status: DC

## 2018-12-29 MED ORDER — MELOXICAM 7.5 MG PO TABS: 15.0000 mg | ORAL_TABLET | Freq: Every day | ORAL | 3 refills | Status: DC

## 2018-12-29 NOTE — Progress Notes (Signed)
Virtual Visit via Telephone Note  I connected with Timothy [name] on 12/29/18 at  9:10 AM EDT by telephone and verified that I am speaking with the correct person using two identifiers.   I discussed the limitations, risks, security and privacy concerns of performing an evaluation and management service by telephone and the availability of in person appointments. I also discussed with the patient that there may be a patient responsible charge related to this service. The patient expressed understanding and agreed to proceed.  Patient location:  home My Location:  [place] office Persons on the call:  Me and the patient   History of Present Illness:  Patient calls needing RF of BP meds.  Denies HA/CP/SOB/dizziness.  Got his flu shot last week.  Last labs 05/2018.  Denies any concerns or complaints today.    Observations/Objective:  NAD.  A&Ox3   Assessment and Plan: 1. Essential hypertension Check BP OOO and make sure <135/85 - lisinopril (ZESTRIL) 40 MG tablet; Take 1 tablet (40 mg total) by mouth daily.  Dispense: 90 tablet; Refill: 1 - Comprehensive metabolic panel; Future - CBC with Differential/Platelet; Future -lab appt given 01/09/2019  2. Chronic kidney disease, stage 2 (mild) - lisinopril (ZESTRIL) 40 MG tablet; Take 1 tablet (40 mg total) by mouth daily.  Dispense: 90 tablet; Refill: 1  3. Chronic bilateral low back pain with right-sided sciatica - gabapentin (NEURONTIN) 300 MG capsule; Take 1 capsule (300 mg total) by mouth 3 (three) times daily.  Dispense: 270 capsule; Refill: 3 - meloxicam (MOBIC) 7.5 MG tablet; Take 2 tablets (15 mg total) by mouth daily.  Dispense: 60 tablet; Refill: 3  4. Screening cholesterol level - Lipid panel; Future    Follow Up Instructions: 3 months with PCP;  Sooner if needed   I discussed the assessment and treatment plan with the patient. The patient was provided an opportunity to ask questions and all were answered. The patient agreed with  the plan and demonstrated an understanding of the instructions.   The patient was advised to call back or seek an in-person evaluation if the symptoms worsen or if the condition fails to improve as anticipated.  I provided 9 minutes of non-face-to-face time during this encounter.   Freeman Caldron, PA-C  Patient ID: Timothy [name], male   DOB: [birth year], 68 y.o.   MRN: [id number]

## 2019-01-06 ENCOUNTER — Telehealth: Payer: Self-pay | Admitting: Family Medicine

## 2019-01-06 NOTE — Telephone Encounter (Signed)
Scheduled

## 2019-01-06 NOTE — Telephone Encounter (Signed)
-----   Message from Debbra Riding, RMA sent at 12/29/2018  8:38 AM EDT ----- Regarding: [facility] F/U APPT 3 MO F/U WITH PCP

## 2019-01-09 ENCOUNTER — Other Ambulatory Visit: Payer: Medicare HMO

## 2019-02-10 ENCOUNTER — Encounter: Payer: Self-pay | Admitting: Family Medicine

## 2019-02-10 ENCOUNTER — Ambulatory Visit (HOSPITAL_COMMUNITY)
Admission: RE | Admit: 2019-02-10 | Discharge: 2019-02-10 | Disposition: A | Discharge status: Home / Self Care | Payer: Medicare HMO | Source: Ambulatory Visit | Attending: Family Medicine | Admitting: Family Medicine

## 2019-02-10 ENCOUNTER — Other Ambulatory Visit: Payer: Self-pay

## 2019-02-10 ENCOUNTER — Ambulatory Visit (HOSPITAL_BASED_OUTPATIENT_CLINIC_OR_DEPARTMENT_OTHER): Payer: Medicare HMO | Admitting: Family Medicine

## 2019-02-10 VITALS — BP 162/95 | HR 76 | Temp 98.2°F | Ht 66.0 in | Wt 135.8 lb

## 2019-02-10 DIAGNOSIS — I10 Essential (primary) hypertension: Secondary | ICD-10-CM

## 2019-02-10 DIAGNOSIS — Z8546 Personal history of malignant neoplasm of prostate: Secondary | ICD-10-CM | POA: Insufficient documentation

## 2019-02-10 DIAGNOSIS — M5441 Lumbago with sciatica, right side: Secondary | ICD-10-CM | POA: Insufficient documentation

## 2019-02-10 DIAGNOSIS — N182 Chronic kidney disease, stage 2 (mild): Secondary | ICD-10-CM

## 2019-02-10 DIAGNOSIS — G8929 Other chronic pain: Secondary | ICD-10-CM | POA: Diagnosis present

## 2019-02-10 DIAGNOSIS — Z79899 Other long term (current) drug therapy: Secondary | ICD-10-CM

## 2019-02-10 DIAGNOSIS — Z720 Tobacco use: Secondary | ICD-10-CM

## 2019-02-10 MED ORDER — AMLODIPINE BESY-BENAZEPRIL HCL 5-20 MG PO CAPS: 1.0000 | ORAL_CAPSULE | Freq: Every day | ORAL | 1 refills | Status: DC

## 2019-02-10 NOTE — Patient Instructions (Signed)
Hypertension, Adult Hypertension is another name for high blood pressure. High blood pressure forces your heart to work harder to pump blood. This can cause problems over time. There are two numbers in a blood pressure reading. There is a top number (systolic) over a bottom number (diastolic). It is best to have a blood pressure that is below 120/80. Healthy choices can help lower your blood pressure, or you may need medicine to help lower it. What are the causes? The cause of this condition is not known. Some conditions may be related to high blood pressure. What increases the risk?  Smoking.  Having type 2 diabetes mellitus, high cholesterol, or both.  Not getting enough exercise or physical activity.  Being overweight.  Having too much fat, sugar, calories, or salt (sodium) in your diet.  Drinking too much alcohol.  Having long-term (chronic) kidney disease.  Having a family history of high blood pressure.  Age. Risk increases with age.  Race. You may be at higher risk if you are African American.  Gender. Men are at higher risk than women before age 45. After age 65, women are at higher risk than men.  Having obstructive sleep apnea.  Stress. What are the signs or symptoms?  High blood pressure may not cause symptoms. Very high blood pressure (hypertensive crisis) may cause: ? Headache. ? Feelings of worry or nervousness (anxiety). ? Shortness of breath. ? Nosebleed. ? A feeling of being sick to your stomach (nausea). ? Throwing up (vomiting). ? Changes in how you see. ? Very bad chest pain. ? Seizures. How is this treated?  This condition is treated by making healthy lifestyle changes, such as: ? Eating healthy foods. ? Exercising more. ? Drinking less alcohol.  Your health care provider may prescribe medicine if lifestyle changes are not enough to get your blood pressure under control, and if: ? Your top number is above 130. ? Your bottom number is above  80.  Your personal target blood pressure may vary. Follow these instructions at home: Eating and drinking   If told, follow the DASH eating plan. To follow this plan: ? Fill one half of your plate at each meal with fruits and vegetables. ? Fill one fourth of your plate at each meal with whole grains. Whole grains include whole-wheat pasta, brown rice, and whole-grain bread. ? Eat or drink low-fat dairy products, such as skim milk or low-fat yogurt. ? Fill one fourth of your plate at each meal with low-fat (lean) proteins. Low-fat proteins include fish, chicken without skin, eggs, beans, and tofu. ? Avoid fatty meat, cured and processed meat, or chicken with skin. ? Avoid pre-made or processed food.  Eat less than 1,500 mg of salt each day.  Do not drink alcohol if: ? Your doctor tells you not to drink. ? You are pregnant, may be pregnant, or are planning to become pregnant.  If you drink alcohol: ? Limit how much you use to:  0-1 drink a day for women.  0-2 drinks a day for men. ? Be aware of how much alcohol is in your drink. In the [place] one drink equals one 12 oz bottle of beer (355 mL), one 5 oz glass of wine (148 mL), or one 1 oz glass of hard liquor (44 mL). Lifestyle   Work with your doctor to stay at a healthy weight or to lose weight. Ask your doctor what the best weight is for you.  Get at least 30 minutes of exercise most   days of the week. This may include walking, swimming, or biking.  Get at least 30 minutes of exercise that strengthens your muscles (resistance exercise) at least 3 days a week. This may include lifting weights or doing Pilates.  Do not use any products that contain nicotine or tobacco, such as cigarettes, e-cigarettes, and chewing tobacco. If you need help quitting, ask your doctor.  Check your blood pressure at home as told by your doctor.  Keep all follow-up visits as told by your doctor. This is important. Medicines  Take over-the-counter  and prescription medicines only as told by your doctor. Follow directions carefully.  Do not skip doses of blood pressure medicine. The medicine does not work as well if you skip doses. Skipping doses also puts you at risk for problems.  Ask your doctor about side effects or reactions to medicines that you should watch for. Contact a doctor if you:  Think you are having a reaction to the medicine you are taking.  Have headaches that keep coming back (recurring).  Feel dizzy.  Have swelling in your ankles.  Have trouble with your vision. Get help right away if you:  Get a very bad headache.  Start to feel mixed up (confused).  Feel weak or numb.  Feel faint.  Have very bad pain in your: ? Chest. ? Belly (abdomen).  Throw up more than once.  Have trouble breathing. Summary  Hypertension is another name for high blood pressure.  High blood pressure forces your heart to work harder to pump blood.  For most people, a normal blood pressure is less than 120/80.  Making healthy choices can help lower blood pressure. If your blood pressure does not get lower with healthy choices, you may need to take medicine. This information is not intended to replace advice given to you by your health care provider. Make sure you discuss any questions you have with your health care provider. Document Released: 09/23/2007 Document Revised: 12/15/2017 Document Reviewed: 12/15/2017 Elsevier Patient Education  2020 [employer].  Steps to Quit Smoking Smoking tobacco is the leading cause of preventable death. It can affect almost every organ in the body. Smoking puts you and people around you at risk for many serious, long-lasting (chronic) diseases. Quitting smoking can be hard, but it is one of the best things that you can do for your health. It is never too late to quit. How do I get ready to quit? When you decide to quit smoking, make a plan to help you succeed. Before you quit:  Pick a  date to quit. Set a date within the next 2 weeks to give you time to prepare.  Write down the reasons why you are quitting. Keep this list in places where you will see it often.  Tell your family, friends, and co-workers that you are quitting. Their support is important.  Talk with your doctor about the choices that may help you quit.  Find out if your health insurance will pay for these treatments.  Know the people, places, things, and activities that make you want to smoke (triggers). Avoid them. What first steps can I take to quit smoking?  Throw away all cigarettes at home, at work, and in your car.  Throw away the things that you use when you smoke, such as ashtrays and lighters.  Clean your car. Make sure to empty the ashtray.  Clean your home, including curtains and carpets. What can I do to help me quit smoking? Talk  with your doctor about taking medicines and seeing a counselor at the same time. You are more likely to succeed when you do both.  If you are pregnant or breastfeeding, talk with your doctor about counseling or other ways to quit smoking. Do not take medicine to help you quit smoking unless your doctor tells you to do so. To quit smoking: Quit right away  Quit smoking totally, instead of slowly cutting back on how much you smoke over a period of time.  Go to counseling. You are more likely to quit if you go to counseling sessions regularly. Take medicine You may take medicines to help you quit. Some medicines need a prescription, and some you can buy over-the-counter. Some medicines may contain a drug called nicotine to replace the nicotine in cigarettes. Medicines may:  Help you to stop having the desire to smoke (cravings).  Help to stop the problems that come when you stop smoking (withdrawal symptoms). Your doctor may ask you to use:  Nicotine patches, gum, or lozenges.  Nicotine inhalers or sprays.  Non-nicotine medicine that is taken by  mouth. Find resources Find resources and other ways to help you quit smoking and remain smoke-free after you quit. These resources are most helpful when you use them often. They include:  Online chats with a Social worker.  Phone quitlines.  Printed Furniture conservator/restorer.  Support groups or group counseling.  Text messaging programs.  Mobile phone apps. Use apps on your mobile phone or tablet that can help you stick to your quit plan. There are many free apps for mobile phones and tablets as well as websites. Examples include Quit Guide from the [employer] and smokefree.gov  What things can I do to make it easier to quit?   Talk to your family and friends. Ask them to support and encourage you.  Call a phone quitline (1-800-QUIT-NOW), reach out to support groups, or work with a Social worker.  Ask people who smoke to not smoke around you.  Avoid places that make you want to smoke, such as: ? Bars. ? Parties. ? Smoke-break areas at work.  Spend time with people who do not smoke.  Lower the stress in your life. Stress can make you want to smoke. Try these things to help your stress: ? Getting regular exercise. ? Doing deep-breathing exercises. ? Doing yoga. ? Meditating. ? Doing a body scan. To do this, close your eyes, focus on one area of your body at a time from head to toe. Notice which parts of your body are tense. Try to relax the muscles in those areas. How will I feel when I quit smoking? Day 1 to 3 weeks Within the first 24 hours, you may start to have some problems that come from quitting tobacco. These problems are very bad 2-3 days after you quit, but they do not often last for more than 2-3 weeks. You may get these symptoms:  Mood swings.  Feeling restless, nervous, angry, or annoyed.  Trouble concentrating.  Dizziness.  Strong desire for high-sugar foods and nicotine.  Weight gain.  Trouble pooping (constipation).  Feeling like you may vomit (nausea).  Coughing or  a sore throat.  Changes in how the medicines that you take for other issues work in your body.  Depression.  Trouble sleeping (insomnia). Week 3 and afterward After the first 2-3 weeks of quitting, you may start to notice more positive results, such as:  Better sense of smell and taste.  Less coughing and sore  throat.  Slower heart rate.  Lower blood pressure.  Clearer skin.  Better breathing.  Fewer sick days. Quitting smoking can be hard. Do not give up if you fail the first time. Some people need to try a few times before they succeed. Do your best to stick to your quit plan, and talk with your doctor if you have any questions or concerns. Summary  Smoking tobacco is the leading cause of preventable death. Quitting smoking can be hard, but it is one of the best things that you can do for your health.  When you decide to quit smoking, make a plan to help you succeed.  Quit smoking right away, not slowly over a period of time.  When you start quitting, seek help from your doctor, family, or friends. This information is not intended to replace advice given to you by your health care provider. Make sure you discuss any questions you have with your health care provider. Document Released: 01/31/2009 Document Revised: 06/24/2018 Document Reviewed: 06/25/2018 Elsevier Patient Education  2020 [employer].

## 2019-02-10 NOTE — Progress Notes (Signed)
Established Patient Office Visit  Subjective:  Patient ID: Timothy [name], male    DOB: 18-May-1950  Age: 68 y.o. MRN: [id number]  CC: No chief complaint on file.   HPI Timothy [name] presents for follow-up of and medical management of chronic medical issues including hypertension, mild chronic kidney disease, chronic back pain with radiation and patient reports a history of prostate cancer.  He states that he had surgery in the past for prostate cancer, he believes that he had a prostatectomy/removal of the prostate.  He denies any current urinary symptoms, no dysuria, no frequency, no difficulty initiating his urinary stream, no nocturia and no weakening of his urinary stream.  He does not have any sensation of incomplete bladder emptying.  He states that he would like blood work done in follow-up of his history of prostate cancer.  He continues to have chronic back pain that is always about a 8 on a 0-to-10 scale and the pain often radiates down his right leg to above the knee.  Low back pain is generally sharp and stabbing and the pain that radiates down his leg is sharp as well.  He is attending pain management at [facility].  He is currently taking gabapentin and hydrocodone-APAP to help with pain.  He denies any issues with constipation due to the pain medication as he does also take over-the-counter medicine to help with constipation.          He reports that he is compliant with the use of his blood pressure medication.  He is currently on lisinopril 40 mg.  He denies any headaches or dizziness related to his blood pressure.  He denies any issues with swelling in his lower legs.  He has had no chest pain or palpitations, no shortness of breath or cough.  No abdominal pain-no nausea or vomiting.  No other muscle or joint pain other than his chronic low back pain.  He denies any night sweats.  He denies any loss of appetite.  Past Medical History:  Diagnosis Date  . Chronic  kidney disease (CKD), stage II (mild)   . Essential hypertension   . Low back pain with radiation   . Personal history of prostate cancer     Past Surgical History:  Procedure Laterality Date  . PROSTATE SURGERY      Family History  Problem Relation Age of Onset  . Hypertension Mother   . Cancer Neg Hx   . Diabetes Neg Hx   . CVA Neg Hx     Social History   Socioeconomic History  . Marital status: Single    Spouse name: Not on file  . Number of children: Not on file  . Years of education: Not on file  . Highest education level: Not on file  Occupational History  . Not on file  Social Needs  . Financial resource strain: Not on file  . Food insecurity    Worry: Not on file    Inability: Not on file  . Transportation needs    Medical: Not on file    Non-medical: Not on file  Tobacco Use  . Smoking status: Current Some Day Smoker    Types: Cigars  . Smokeless tobacco: Never Used  Substance and Sexual Activity  . Alcohol use: Yes  . Drug use: Yes    Types: Marijuana  . Sexual activity: Not Currently  Lifestyle  . Physical activity    Days per week: Not on file  Minutes per session: Not on file  . Stress: Not on file  Relationships  . Social Herbalist on phone: Not on file    Gets together: Not on file    Attends religious service: Not on file    Active member of club or organization: Not on file    Attends meetings of clubs or organizations: Not on file    Relationship status: Not on file  . Intimate partner violence    Fear of current or ex partner: Not on file    Emotionally abused: Not on file    Physically abused: Not on file    Forced sexual activity: Not on file  Other Topics Concern  . Not on file  Social History Narrative  . Not on file    Outpatient Medications Prior to Visit  Medication Sig Dispense Refill  . gabapentin (NEURONTIN) 300 MG capsule Take 1 capsule (300 mg total) by mouth 3 (three) times daily. 270 capsule 3  .  HYDROcodone-acetaminophen (NORCO/VICODIN) 5-325 MG tablet     . lisinopril (ZESTRIL) 40 MG tablet Take 1 tablet (40 mg total) by mouth daily. 90 tablet 1  . meloxicam (MOBIC) 7.5 MG tablet Take 2 tablets (15 mg total) by mouth daily. 60 tablet 3  . NARCAN 4 MG/0.1ML LIQD nasal spray kit      No facility-administered medications prior to visit.     No Known Allergies  ROS Review of Systems  Constitutional: Positive for fatigue (Mild, he believes this is related to his chronic low back pain). Negative for chills, fever and unexpected weight change.  HENT: Negative for sore throat and trouble swallowing.   Eyes: Negative for photophobia and visual disturbance.       Wears glasses  Respiratory: Negative for cough and shortness of breath.   Cardiovascular: Negative for chest pain, palpitations and leg swelling.  Gastrointestinal: Negative for abdominal pain, constipation, diarrhea and nausea.  Endocrine: Negative for cold intolerance, heat intolerance, polydipsia, polyphagia and polyuria.  Genitourinary: Negative for difficulty urinating, dysuria, flank pain, frequency and hematuria.  Musculoskeletal: Positive for back pain. Negative for arthralgias.  Neurological: Negative for dizziness and headaches.  Hematological: Negative for adenopathy. Does not bruise/bleed easily.      Objective:    Physical Exam  Constitutional: He is oriented to person, place, and time. He appears well-developed and well-nourished.  Well-nourished well-developed older male in no acute distress, patient does appear slightly older than stated age  Neck: Normal range of motion. Neck supple. No JVD present. No tracheal deviation present. No thyromegaly present.  Cardiovascular: Normal rate and regular rhythm.  No CVA tenderness  Pulmonary/Chest: Effort normal and breath sounds normal.  Abdominal: Soft. There is no abdominal tenderness. There is no rebound and no guarding.  Musculoskeletal:        General:  Tenderness (Lumbosacral tenderness to palpation and right SI joint tenderness) present. No edema.  Lymphadenopathy:    He has no cervical adenopathy.  Neurological: He is alert and oriented to person, place, and time.  Skin: Skin is warm and dry.  Psychiatric: He has a normal mood and affect. His behavior is normal.  Nursing note and vitals reviewed.   BP (!) 162/95   Pulse 76   Temp 98.2 F (36.8 C) (Oral)   Ht _0  (1.676 m)   Wt 135 lb 12.8 oz (61.6 kg)   SpO2 100%   BMI 21.92 kg/m  Wt Readings from Last 3 Encounters:  02/10/19  135 lb 12.8 oz (61.6 kg)  04/11/18 147 lb (66.7 kg)  03/14/18 145 lb 3.2 oz (65.9 kg)     Health Maintenance Due  Topic Date Due  . Hepatitis C Screening  05-27-1950  . COLONOSCOPY  11/05/2013  . PNA vac Low Risk Adult (2 of 2 - PCV13) 11/23/2018   Believes that he is received influenza immunization and possibly pneumonia vaccine recently at his local pharmacy  Lab Results  Component Value Date   TSH 0.808 10/19/2007   Lab Results  Component Value Date   WBC 5.5 03/14/2018   HGB 13.6 03/14/2018   HCT 39.7 03/14/2018   MCV 90 03/14/2018   PLT 171 03/14/2018   Lab Results  Component Value Date   NA 142 05/26/2018   K 4.1 05/26/2018   CO2 25 05/26/2018   GLUCOSE 76 05/26/2018   BUN 16 05/26/2018   CREATININE 1.38 (H) 05/26/2018   BILITOT 0.8 05/26/2018   ALKPHOS 53 05/26/2018   AST 18 05/26/2018   ALT 12 05/26/2018   PROT 7.9 05/26/2018   ALBUMIN 4.7 05/26/2018   CALCIUM 10.0 05/26/2018   Lab Results  Component Value Date   CHOL 125 03/14/2018   Lab Results  Component Value Date   HDL 43 03/14/2018   Lab Results  Component Value Date   LDLCALC 67 03/14/2018   Lab Results  Component Value Date   TRIG 77 03/14/2018   Lab Results  Component Value Date   CHOLHDL 2.9 03/14/2018   No results found for: HGBA1C    Assessment & Plan:  1. Essential hypertension Blood pressure in not controlled on current medication.   Patient's lisinopril will be discontinued.  He will be placed on combination amlodipine benazepril at 5-20 mg.  He is also encouraged to follow a low-sodium/DASH diet.  Patient will have follow-up appointment with clinical pharmacist in follow-up of poorly controlled hypertension and medication change.  Patient will have recheck of electrolytes as part of comprehensive metabolic panel at today's visit. - Comprehensive metabolic panel - amLODipine-benazepril (LOTREL) 5-20 MG capsule; Take 1 capsule by mouth daily. To lower blood pressure  Dispense: 90 capsule; Refill: 1 - Amb Referral to Clinical Pharmacist  2. Chronic kidney disease, stage 2 (mild) Patient will have repeat electrolytes/creatinine as well as CBC done in follow-up of chronic kidney disease.  Creatinine was 1.38 with normal GFR of 61 on lab work done in February of this year.  He is to remain hydrated, make sure that his blood pressure remains controlled and avoid the use of nonsteroidal anti-inflammatories. - Comprehensive metabolic panel - CBC with Differential  3. Chronic bilateral low back pain with right-sided sciatica Patient with complaint of continued chronic low back pain with right-sided radiation.  He is currently on chronic opioid therapy for pain management.  We will have patient repeat lumbar spine film due to his continued back pain and history of prostate cancer to make sure that there are no lytic lesions.  Patient will also have CBC and PSA at today's visit. - DG Lumbar Spine Complete; Future - CBC with Differential - PSA  4. History of prostate cancer Patient reports a history of prostate cancer.  I cannot find past urology notes with matching diagnosis but patient review of chart had transurethral resection of the prostate/TURP in April 2005 which per pathology report showed benign prostate tissue.  I am not sure if patient is confusing BPH with prostate cancer or if he had additional procedure on a  different date.   Will check PSA as this should not be elevated if he had resection/removal of the prostate and also check CBC for any signs of anemia or infection/inflammation and have patient obtain a lumbar spine film as he also has continued low back pain - DG Lumbar Spine Complete; Future - CBC with Differential - PSA  5. Encounter for long-term current use of medication  Patient will have comprehensive metabolic panel and CBC at today's visit in follow-up of long-term use of medications including medicines for hypertension as well as opioid therapy - Comprehensive metabolic panel - CBC with Differential  6. Tobacco use Discussed smoking cessation with the patient and recommended complete smoking cessation.  Total of approximately 5 minutes spent discussing the need for smoking cessation as well as interventions that can help with smoking cessation with the patient.  He was also made aware that if he changes his mind in the future regarding wanting to stop smoking, that he can also schedule appointment with the clinical pharmacist here in the office for help with smoking cessation.  An After Visit Summary was printed and given to the patient.  Follow-up: Return in about 4 months (around 06/13/2019) for HTN:2-3 weeks with Lurena Joiner.   Antony Blackbird, MD

## 2019-02-10 NOTE — Progress Notes (Signed)
Per pt he's here to talk about his Prostate

## 2019-02-11 LAB — CBC WITH DIFFERENTIAL/PLATELET
Basophils Absolute: 0 x10E3/uL (ref 0.0–0.2)
Basos: 1 %
EOS (ABSOLUTE): 0.5 x10E3/uL — ABNORMAL HIGH (ref 0.0–0.4)
Eos: 9 %
Hematocrit: 41.7 % (ref 37.5–51.0)
Hemoglobin: 13.8 g/dL (ref 13.0–17.7)
Immature Grans (Abs): 0 x10E3/uL (ref 0.0–0.1)
Immature Granulocytes: 0 %
Lymphocytes Absolute: 2.8 x10E3/uL (ref 0.7–3.1)
Lymphs: 53 %
MCH: 31.1 pg (ref 26.6–33.0)
MCHC: 33.1 g/dL (ref 31.5–35.7)
MCV: 94 fL (ref 79–97)
Monocytes Absolute: 0.7 x10E3/uL (ref 0.1–0.9)
Monocytes: 13 %
Neutrophils Absolute: 1.3 x10E3/uL — ABNORMAL LOW (ref 1.4–7.0)
Neutrophils: 24 %
Platelets: 198 x10E3/uL (ref 150–450)
RBC: 4.44 x10E6/uL (ref 4.14–5.80)
RDW: 13 % (ref 11.6–15.4)
WBC: 5.3 x10E3/uL (ref 3.4–10.8)

## 2019-02-11 LAB — COMPREHENSIVE METABOLIC PANEL WITH GFR
ALT: 13 IU/L (ref 0–44)
AST: 20 IU/L (ref 0–40)
Albumin/Globulin Ratio: 1.6 (ref 1.2–2.2)
Albumin: 4.5 g/dL (ref 3.8–4.8)
Alkaline Phosphatase: 49 IU/L (ref 39–117)
BUN/Creatinine Ratio: 14 (ref 10–24)
BUN: 19 mg/dL (ref 8–27)
Bilirubin Total: 0.5 mg/dL (ref 0.0–1.2)
CO2: 26 mmol/L (ref 20–29)
Calcium: 9.6 mg/dL (ref 8.6–10.2)
Chloride: 101 mmol/L (ref 96–106)
Creatinine, Ser: 1.33 mg/dL — ABNORMAL HIGH (ref 0.76–1.27)
GFR calc Af Amer: 64 mL/min/1.73
GFR calc non Af Amer: 55 mL/min/1.73 — ABNORMAL LOW
Globulin, Total: 2.9 g/dL (ref 1.5–4.5)
Glucose: 79 mg/dL (ref 65–99)
Potassium: 4.8 mmol/L (ref 3.5–5.2)
Sodium: 139 mmol/L (ref 134–144)
Total Protein: 7.4 g/dL (ref 6.0–8.5)

## 2019-02-11 LAB — PSA: Prostate Specific Ag, Serum: 3 ng/mL (ref 0.0–4.0)

## 2019-02-12 ENCOUNTER — Other Ambulatory Visit (HOSPITAL_BASED_OUTPATIENT_CLINIC_OR_DEPARTMENT_OTHER): Payer: Medicare HMO | Admitting: Family Medicine

## 2019-02-12 DIAGNOSIS — Z8546 Personal history of malignant neoplasm of prostate: Secondary | ICD-10-CM | POA: Diagnosis not present

## 2019-02-12 NOTE — Progress Notes (Signed)
Patient ID: Timothy [name], male   DOB: 03-29-1951, 68 y.o.   MRN: [id number]   Patient reports a history of removal of the prostate as treatment for prostate cancer.  Patient however with a PSA of 3.0.  Patient is being referred to urology for further evaluation and treatment if needed.

## 2019-02-27 ENCOUNTER — Other Ambulatory Visit: Payer: Self-pay

## 2019-02-27 ENCOUNTER — Ambulatory Visit: Payer: Medicare HMO | Attending: Family Medicine | Admitting: Pharmacist

## 2019-02-27 ENCOUNTER — Encounter: Payer: Self-pay | Admitting: Pharmacist

## 2019-02-27 VITALS — BP 132/85 | HR 77

## 2019-02-27 DIAGNOSIS — I10 Essential (primary) hypertension: Secondary | ICD-10-CM | POA: Diagnosis not present

## 2019-02-27 NOTE — Progress Notes (Signed)
   S:    PCP: Dr. Chapman Fitch  Patient arrives in good spirits.    Presents to the clinic for hypertension evaluation, counseling, and management.  Patient was referred and last seen by Primary Care Provider on 02/10/19 - 162/95.     Patient reports adherence with medications.  Patient denies chest pain, dyspnea, HA or blurred vision. Does endorse being in general pain.   Current BP Medications include:  Amlodipine-benazepril 5 - 20 mg daily **Lisinopril - endorsed still taking until this appointment. Patient was instructed to stop taking the lisinopril and continue with the Lotrel.  Antihypertensives tried in the past include: lisinopril   Dietary habits include:  Reports limiting salt; denies caffeine intake  Exercise habits include: walks ~1 mile daily  Family / Social history:  - Fhx: DM, cancer, stroke  - Tobacco: occasionally smokes cigars but not every day - Alcohol: denies  O:  Vitals:   02/27/19 0858  BP: 132/85  Pulse: 77   Home BP readings: no cuff to check at home  Last 3 Office BP readings: BP Readings from Last 3 Encounters:  02/27/19 02/10/19 132/85 (!) 162/95  06/15/18 (!) 153/89   BMET    Component Value Date/Time   NA 139 02/10/2019 1122   K 4.8 02/10/2019 1122   CL 101 02/10/2019 1122   CO2 26 02/10/2019 1122   GLUCOSE 79 02/10/2019 1122   GLUCOSE 94 05/06/2010 2038   BUN 19 02/10/2019 1122   CREATININE 1.33 (H) 02/10/2019 1122   CALCIUM 9.6 02/10/2019 1122   GFRNONAA 55 (L) 02/10/2019 1122   GFRAA 64 02/10/2019 1122   Renal function: CrCl cannot be calculated (Unknown ideal weight.).  Clinical ASCVD: No  The ASCVD Risk score Mikey Bussing DC Jr., et al., 2013) failed to calculate for the following reasons:   The valid total cholesterol range is 130 to 320 mg/dL  A/P: Hypertension longstanding currently improving on current medications. BP Goal <130/80 mmHg. Patient reports adherence to medications.  - Stop taking lisinopril.  - Continue Lotrel 5-20  mg daily. -Counseled on lifestyle modifications for blood pressure control including reduced dietary sodium, increased exercise, adequate sleep  Results reviewed and written information provided.   Total time in face-to-face counseling 20 minutes.   F/U Clinic Visit in December.    Benard Halsted, PharmD, [place] [phone]  Patient seen with: Lesle Reek, PharmD [employer]

## 2019-02-27 NOTE — Patient Instructions (Signed)
Thank you for coming to see us today.   Blood pressure today is improving.  Continue taking blood pressure medications as prescribed.   Limiting salt and caffeine, as well as exercising as able for at least 30 minutes for 5 days out of the week, can also help you lower your blood pressure.  Take your blood pressure at home if you are able. Please write down these numbers and bring them to your visits.  If you have any questions about medications, please call me [phone].  Luke  

## 2019-03-30 ENCOUNTER — Other Ambulatory Visit: Payer: Self-pay

## 2019-03-30 ENCOUNTER — Ambulatory Visit: Payer: Medicare HMO | Attending: Family Medicine | Admitting: Pharmacist

## 2019-03-30 VITALS — BP 157/82 | HR 71

## 2019-03-30 DIAGNOSIS — Z0131 Encounter for examination of blood pressure with abnormal findings: Secondary | ICD-10-CM | POA: Diagnosis not present

## 2019-03-30 DIAGNOSIS — I10 Essential (primary) hypertension: Secondary | ICD-10-CM

## 2019-03-30 MED ORDER — AMLODIPINE BESY-BENAZEPRIL HCL 10-40 MG PO CAPS: 1.0000 | ORAL_CAPSULE | Freq: Every day | ORAL | 0 refills | Status: DC

## 2019-03-30 NOTE — Progress Notes (Signed)
   S:    PCP: Dr. Chapman Fitch  Patient arrives in good spirits.    Presents to the clinic for hypertension evaluation, counseling, and management.  Patient was referred and last seen by Primary Care Provider on 02/10/19. I saw him 02/27/19 and instructed him to stop lisinopril as he was taking this along with his Lotrel.    Patient reports adherence with medications.  Patient denies chest pain, dyspnea, HA or blurred vision.   Current BP Medications include:  Amlodipine-benazepril 5 - 20 mg daily  Antihypertensives tried in the past include: lisinopril   Dietary habits include:  Reports limiting salt; denies caffeine intake  Exercise habits include: walks ~1 mile daily  Family / Social history:  - Fhx: DM, cancer, stroke  - Tobacco: occasionally smokes cigars but not every day - Alcohol: denies  O:  Vitals:   03/30/19 0904  BP: (!) 157/82  Pulse: 71   Home BP readings: no cuff to check at home  Last 3 Office BP readings: BP Readings from Last 3 Encounters:  02/27/19 02/10/19 132/85 (!) 162/95  06/15/18 (!) 153/89   BMET    Component Value Date/Time   NA 139 02/10/2019 1122   K 4.8 02/10/2019 1122   CL 101 02/10/2019 1122   CO2 26 02/10/2019 1122   GLUCOSE 79 02/10/2019 1122   GLUCOSE 94 05/06/2010 2038   BUN 19 02/10/2019 1122   CREATININE 1.33 (H) 02/10/2019 1122   CALCIUM 9.6 02/10/2019 1122   GFRNONAA 55 (L) 02/10/2019 1122   GFRAA 64 02/10/2019 1122   Renal function: CrCl cannot be calculated (Unknown ideal weight.).  Clinical ASCVD: No  The ASCVD Risk score Mikey Bussing DC Jr., et al., 2013) failed to calculate for the following reasons:   The valid total cholesterol range is 130 to 320 mg/dL  A/P: Hypertension longstanding currently above goal on current medications. BP Goal <130/80 mmHg. Patient reports adherence to medications and has taken today.   -Increase Lotrel to 10-40 mg daily.  -Counseled on lifestyle modifications for blood pressure control including  reduced dietary sodium, increased exercise, adequate sleep  Results reviewed and written information provided.   Total time in face-to-face counseling 20 minutes.   F/U Clinic Visit w/ PCP in Feb.    Benard Halsted, PharmD, [place] [phone]

## 2019-03-30 NOTE — Patient Instructions (Signed)
Thank you for coming to see [place] today.   Blood pressure today is elevated.  Increase your Lotrel dose to 10-40 mg. You'll take 1 capsule daily.    Limiting salt and caffeine, as well as exercising as able for at least 30 minutes for 5 days out of the week, can also help you lower your blood pressure.  Take your blood pressure at home if you are able. Please write down these numbers and bring them to your visits.  If you have any questions about medications, please call me [phone].  Lurena Joiner

## 2019-03-31 ENCOUNTER — Ambulatory Visit: Payer: Medicare HMO | Admitting: Family Medicine

## 2019-05-11 ENCOUNTER — Telehealth: Payer: Self-pay | Admitting: Family Medicine

## 2019-05-11 NOTE — Telephone Encounter (Signed)
1) Medication(s) Requested (by name): -HYDROcodone-acetaminophen (NORCO/VICODIN) 5-325 MG tablet  -gabapentin (NEURONTIN) 300 MG capsule   2) Pharmacy of Choice: -[facility] [address], [place], [place] [id number]

## 2019-05-11 NOTE — Telephone Encounter (Signed)
Will need to discuss at his appointment but this office generally does not prescribe long term opioid medication such as the one he is requesting. Would he like a pain management referral. Also this is an out of town pharmacy request.

## 2019-05-11 NOTE — Telephone Encounter (Signed)
Patient family member called on refill for HYDROcodene informed what the PCP wrote about pain management she then stated she will share her gabapentin as it is the same dose.

## 2019-05-11 NOTE — Telephone Encounter (Signed)
Advised patient not to take medication that has not been prescribed by his PCP. Patient states Ibuprofen does give some relief but still has pain.  Moved appt. To closer date.

## 2019-05-29 ENCOUNTER — Encounter: Payer: Self-pay | Admitting: Family Medicine

## 2019-05-29 ENCOUNTER — Ambulatory Visit (HOSPITAL_BASED_OUTPATIENT_CLINIC_OR_DEPARTMENT_OTHER): Payer: Medicare HMO | Admitting: Family Medicine

## 2019-05-29 DIAGNOSIS — I10 Essential (primary) hypertension: Secondary | ICD-10-CM

## 2019-05-29 DIAGNOSIS — M25561 Pain in right knee: Secondary | ICD-10-CM

## 2019-05-29 DIAGNOSIS — M5441 Lumbago with sciatica, right side: Secondary | ICD-10-CM | POA: Diagnosis not present

## 2019-05-29 DIAGNOSIS — N182 Chronic kidney disease, stage 2 (mild): Secondary | ICD-10-CM

## 2019-05-29 DIAGNOSIS — G8929 Other chronic pain: Secondary | ICD-10-CM

## 2019-05-29 DIAGNOSIS — Z8546 Personal history of malignant neoplasm of prostate: Secondary | ICD-10-CM

## 2019-05-29 DIAGNOSIS — R972 Elevated prostate specific antigen [PSA]: Secondary | ICD-10-CM

## 2019-05-29 NOTE — Progress Notes (Signed)
Patient verified DOB Patient has eaten today. Patient has taken medication today. Patient complains of pain in the back and right knee scaled at a 9. This is chronic pain.

## 2019-05-29 NOTE — Progress Notes (Signed)
Virtual Visit via Telephone Note  I connected with Timothy [name] on 05/29/19 at  8:30 AM EST by telephone and verified that I am speaking with the correct person using two identifiers.   I discussed the limitations, risks, security and privacy concerns of performing an evaluation and management service by telephone and the availability of in person appointments. I also discussed with the patient that there may be a patient responsible charge related to this service. The patient expressed understanding and agreed to proceed.  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  Patient reports that he continues to have a significant amount of pain.  He reports chronic pain being a 9 on a 0-to-10 scale with 0 is no pain and 10 is the worst imaginable pain.  He reports that he no longer follows up with orthopedics regarding his right knee pain secondary to the cost.  On review of medications, he denies that he is currently taking hydrocodone/APAP. (PDMP review of medications indicates the patient received #90 hydrocodone-acetaminophen 5-3 25 from provider Jeanella Anton who I believe may be associated with [facility] pain [facility].).  He reports no headaches or dizziness related to his blood pressure.  He states that he is not monitoring his blood pressure however.  He reports compliance with his blood pressure medication amlodipine-benazepril 10-40.  He denies any issues with chest pain or palpitations, no shortness of breath or cough.  He denies any abdominal pain-no nausea/vomiting/diarrhea or constipation.  He is avoiding the use of nonsteroidal anti-inflammatory medications due to history of  chronic kidney disease.          Upon questioning, patient states that he had removal of his prostate as treatment for prostate cancer in the past.  Patient has not followed up with urology recently.  He denies any current urinary symptoms.  No frequency or dysuria.  Past Medical History:  Diagnosis Date  . Chronic kidney disease (CKD), stage II (mild)   . Essential hypertension   . Low back pain with radiation   . Personal history of prostate cancer     Past Surgical History:  Procedure Laterality Date  . PROSTATE SURGERY      Family History  Problem Relation Age of Onset  . Hypertension Mother   . Cancer Neg Hx   . Diabetes Neg Hx   . CVA Neg Hx     Social History   Tobacco Use  . Smoking status: Current Some Day Smoker    Types: Cigars  . Smokeless tobacco: Never Used  Substance Use Topics  . Alcohol use: Yes  . Drug use: Yes    Types: Marijuana     No Known Allergies     Observations/Objective: No vital signs or physical exam conducted as visit was done via telephone  Assessment and Plan: 1. Essential hypertension 2. Chronic kidney disease, stage 2 (mild) He reports that he does not need a refill of his blood pressure medication at today's visit.  He is encouraged to remain compliant with daily use of medication, to remain well-hydrated and to follow a low sodium diet.  Patient will continue avoidance of nonsteroidal anti-inflammatory medication secondary to chronic kidney disease.  He has been asked to return to the office in approximately 4 months and to have blood work done at that time.  3. Chronic bilateral low back pain with right-sided sciatica 4. Chronic pain of right knee Patient was offered referral to orthopedics in follow-up of his chronic right knee pain which patient declines.  On review of narcotic/controlled substances, he appears to receive monthly hydrocodone-APAP 5/325 from [facility] medical pain management.  5. History of prostate cancer 6.  Abnormal prostate specific antigen (PSA) Patient was contacted in the past regarding abnormal PSA and that referral had been made to urology and the importance of follow-up with stress at that time.  At today's visit, he states that he has not followed up with urology and was not aware of abnormal PSA.  Again discussed with the patient that if he has had his prostate removed in the past for treatment of prostate cancer then he has recent PSA done on 02/10/2019 was abnormal at 3.0 as a level should be 0.0 if he has had prior prostate removal.  New referral will be placed to urology and patient has been asked to contact the office if he has not heard from this office or from urology regarding follow-up appointment. - Ambulatory referral to Urology  Follow Up Instructions:Return in about 4 months (around 09/26/2019) for chronic issues and fasting labs.   I discussed the assessment and treatment plan with the patient. The patient was provided an opportunity to ask questions and all were answered. The patient agreed with the plan and demonstrated an understanding of the instructions.   The patient was advised to call back or seek an in-person evaluation if the symptoms worsen or if the condition fails to improve as anticipated.  I provided 15  minutes of non-face-to-face time during this encounter.   Antony Blackbird, MD

## 2019-05-30 ENCOUNTER — Ambulatory Visit: Payer: Medicare HMO | Admitting: Pharmacist

## 2019-06-14 ENCOUNTER — Ambulatory Visit: Payer: Medicare HMO | Attending: Family Medicine | Admitting: Family Medicine

## 2019-06-14 ENCOUNTER — Other Ambulatory Visit: Payer: Self-pay

## 2019-06-14 NOTE — Progress Notes (Signed)
Patient ID: Timothy [name], male   DOB: 05-16-1950, 69 y.o.   MRN: [id number]   Patient with appointment today but this was made in error as he was recently seen and does not need follow-up for 3-4 months and had no acute issues today

## 2019-07-25 ENCOUNTER — Other Ambulatory Visit: Payer: Self-pay

## 2019-07-25 ENCOUNTER — Ambulatory Visit: Payer: Medicare HMO | Attending: Family Medicine | Admitting: Internal Medicine

## 2019-07-25 DIAGNOSIS — G8929 Other chronic pain: Secondary | ICD-10-CM | POA: Diagnosis not present

## 2019-07-25 DIAGNOSIS — I10 Essential (primary) hypertension: Secondary | ICD-10-CM | POA: Diagnosis not present

## 2019-07-25 DIAGNOSIS — M5441 Lumbago with sciatica, right side: Secondary | ICD-10-CM | POA: Diagnosis not present

## 2019-07-25 MED ORDER — AMLODIPINE BESY-BENAZEPRIL HCL 10-40 MG PO CAPS: 1.0000 | ORAL_CAPSULE | Freq: Every day | ORAL | 0 refills | Status: DC

## 2019-07-25 MED ORDER — GABAPENTIN 300 MG PO CAPS: 300.0000 mg | ORAL_CAPSULE | Freq: Three times a day (TID) | ORAL | 0 refills | Status: DC

## 2019-07-25 NOTE — Progress Notes (Signed)
Virtual Visit via Telephone Note Due to current restrictions/limitations of in-office visits due to the COVID-19 pandemic, this scheduled clinical appointment was converted to a telehealth visit  I connected with Timothy [name] on 07/25/19 at 10:46 a.m by telephone and verified that I am speaking with the correct person using two identifiers. I am in my office.  The patient is at home.  Only the patient and myself participated in this encounter.  I discussed the limitations, risks, security and privacy concerns of performing an evaluation and management service by telephone and the availability of in person appointments. I also discussed with the patient that there may be a patient responsible charge related to this service. The patient expressed understanding and agreed to proceed.   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  PCP is Dr. Chapman Fitch.Marland Kitchen   HTN: needing RF on Lotrel. Had BP checked this a.m at Dr. Lanny Hurst office and was told BP was good.  He does not recall the reading.  He denies any chest pains or shortness of breath.  Needing RF on Gabapentin which he takes for chronic back pain.  Reports that he is doing well on the current dose. Outpatient Encounter Medications as of 07/25/2019  Medication Sig  . amLODipine-benazepril (LOTREL) 10-40 MG capsule Take 1 capsule by mouth daily.  Marland Kitchen gabapentin (NEURONTIN) 300 MG capsule Take 1 capsule (300 mg total) by mouth 3 (three) times daily.  Marland Kitchen HYDROcodone-acetaminophen (NORCO/VICODIN) 5-325 MG tablet   . meloxicam (MOBIC) 7.5 MG tablet Take 2 tablets (15 mg total) by mouth daily. (Patient not taking: Reported on 07/25/2019)  . NARCAN 4 MG/0.1ML LIQD nasal spray kit    No  facility-administered encounter medications on file as of 07/25/2019.    Observations/Objective: No direct observation done as this was a telephone encounter.  Assessment and Plan: 1. Essential hypertension - amLODipine-benazepril (LOTREL) 10-40 MG capsule; Take 1 capsule by mouth daily.  Dispense: 90 capsule; Refill: 0  2. Chronic bilateral low back pain with right-sided sciatica - gabapentin (NEURONTIN) 300 MG capsule; Take 1 capsule (300 mg total) by mouth 3 (three) times daily.  Dispense: 270 capsule; Refill: 0   Follow Up Instructions: Follow-up with his PCP in 2 months for chronic disease management.   I discussed the assessment and treatment plan with the patient. The patient was provided an opportunity to ask questions and all were answered. The patient agreed with the plan and demonstrated an understanding of the instructions.   The patient was advised to call back or seek an in-person evaluation if the symptoms worsen or if the condition fails to improve as anticipated.  I provided 4 minutes of non-face-to-face time during this encounter.   Karle Plumber, MD

## 2019-07-26 ENCOUNTER — Ambulatory Visit: Payer: Medicare HMO | Admitting: Family Medicine

## 2019-10-18 ENCOUNTER — Other Ambulatory Visit: Payer: Self-pay

## 2019-10-18 ENCOUNTER — Ambulatory Visit: Payer: Medicare HMO | Attending: Family Medicine | Admitting: Family Medicine

## 2019-10-18 ENCOUNTER — Encounter: Payer: Self-pay | Admitting: Family Medicine

## 2019-10-18 VITALS — BP 137/77 | HR 77 | Ht 66.0 in | Wt 133.0 lb

## 2019-10-18 DIAGNOSIS — Z8546 Personal history of malignant neoplasm of prostate: Secondary | ICD-10-CM | POA: Diagnosis not present

## 2019-10-18 DIAGNOSIS — R310 Gross hematuria: Secondary | ICD-10-CM

## 2019-10-18 DIAGNOSIS — I10 Essential (primary) hypertension: Secondary | ICD-10-CM

## 2019-10-18 DIAGNOSIS — M5441 Lumbago with sciatica, right side: Secondary | ICD-10-CM

## 2019-10-18 DIAGNOSIS — G8929 Other chronic pain: Secondary | ICD-10-CM

## 2019-10-18 LAB — POCT URINALYSIS DIP (CLINITEK)
Bilirubin, UA: NEGATIVE
Glucose, UA: NEGATIVE mg/dL
Ketones, POC UA: NEGATIVE mg/dL
Leukocytes, UA: NEGATIVE
Nitrite, UA: NEGATIVE
POC PROTEIN,UA: 30 — AB
Spec Grav, UA: >=1.03 — AB (ref 1.010–1.025)
Urobilinogen, UA: 0.2 E.U./dL
pH, UA: 5.5 (ref 5.0–8.0)

## 2019-10-18 MED ORDER — AMLODIPINE BESY-BENAZEPRIL HCL 10-40 MG PO CAPS: 1.0000 | ORAL_CAPSULE | Freq: Every day | ORAL | 0 refills | Status: DC

## 2019-10-18 MED ORDER — GABAPENTIN 300 MG PO CAPS: 300.0000 mg | ORAL_CAPSULE | Freq: Three times a day (TID) | ORAL | 0 refills | Status: DC

## 2019-10-18 NOTE — Progress Notes (Signed)
Subjective:  Patient ID: Timothy [name], male    DOB: 09-May-1950  Age: 69 y.o. MRN: [id number]  CC: Hematuria   HPI Timothy [name] is a 69 year old male patient of Dr Chapman Fitch with a history of hypertension, chronic back pain, prostate cancer status post prostatectomy who presents today for an acute visit.  He had an episode of gross hematuria 4 days ago which occurred during micturition and he denies presence of pain. Denies presence of flank pain, abdominal pain, fever, irritative or obstructive urinary symptoms. Not currently followed by urology.  Compliant with his antihypertensive and for his chronic back pain he is on gabapentin. Past Medical History:  Diagnosis Date  . Chronic kidney disease (CKD), stage II (mild)   . Essential hypertension   . Low back pain with radiation   . Personal history of prostate cancer     Past Surgical History:  Procedure Laterality Date  . PROSTATE SURGERY      Family History  Problem Relation Age of Onset  . Hypertension Mother   . Cancer Neg Hx   . Diabetes Neg Hx   . CVA Neg Hx     No Known Allergies  Outpatient Medications Prior to Visit  Medication Sig Dispense Refill  . amLODipine-benazepril (LOTREL) 10-40 MG capsule Take 1 capsule by mouth daily. 90 capsule 0  . gabapentin (NEURONTIN) 300 MG capsule Take 1 capsule (300 mg total) by mouth 3 (three) times daily. 270 capsule 0  . HYDROcodone-acetaminophen (NORCO/VICODIN) 5-325 MG tablet     . NARCAN 4 MG/0.1ML LIQD nasal spray kit     . meloxicam (MOBIC) 7.5 MG tablet Take 2 tablets (15 mg total) by mouth daily. (Patient not taking: Reported on 07/25/2019) 60 tablet 3   No facility-administered medications prior to visit.     ROS Review of Systems  Constitutional: Negative for activity change and appetite change.  HENT: Negative for sinus pressure and sore throat.   Eyes: Negative for visual disturbance.  Respiratory: Negative for cough, chest tightness and shortness of  breath.   Cardiovascular: Negative for chest pain and leg swelling.  Gastrointestinal: Negative for abdominal distention, abdominal pain, constipation and diarrhea.  Endocrine: Negative.   Genitourinary: Positive for hematuria. Negative for dysuria.  Musculoskeletal: Negative for joint swelling and myalgias.  Skin: Negative for rash.  Allergic/Immunologic: Negative.   Neurological: Negative for weakness, light-headedness and numbness.  Psychiatric/Behavioral: Negative for dysphoric mood and suicidal ideas.    Objective:  BP 137/77   Pulse 77   Ht _0  (1.676 m)   Wt 133 lb (60.3 kg)   SpO2 100%   BMI 21.47 kg/m   BP/Weight 10/18/2019 03/30/2019 22/09/3333  Systolic BP 456 256 389  Diastolic BP 77 82 85  Wt. (Lbs) 133 - -  BMI 21.47 - -      Physical Exam Constitutional:      Appearance: He is well-developed.  Neck:     Vascular: No JVD.  Cardiovascular:     Rate and Rhythm: Normal rate.     Heart sounds: Normal heart sounds. No murmur heard.   Pulmonary:     Effort: Pulmonary effort is normal.     Breath sounds: Normal breath sounds. No wheezing or rales.  Chest:     Chest wall: No tenderness.  Abdominal:     General: Bowel sounds are normal. There is no distension.     Palpations: Abdomen is soft. There is no mass.     Tenderness: There  is no abdominal tenderness.  Musculoskeletal:        General: Normal range of motion.     Right lower leg: No edema.     Left lower leg: No edema.  Neurological:     Mental Status: He is alert and oriented to person, place, and time.  Psychiatric:        Mood and Affect: Mood normal.     CMP Latest Ref Rng & Units 02/10/2019 05/26/2018 04/11/2018  Glucose 65 - 99 mg/dL 79 76 78  BUN 8 - 27 mg/dL _0 Creatinine 0.76 - 1.27 mg/dL 1.33(H) 1.38(H) 1.12  Sodium 134 - 144 mmol/L 139 142 145(H)  Potassium 3.5 - 5.2 mmol/L 4.8 4.1 3.9  Chloride 96 - 106 mmol/L 101 98 103  CO2 20 - 29 mmol/L _1 Calcium 8.6 - 10.2  mg/dL 9.6 10.0 9.6  Total Protein 6.0 - 8.5 g/dL 7.4 7.9 7.3  Total Bilirubin 0.0 - 1.2 mg/dL 0.5 0.8 0.6  Alkaline Phos 39 - 117 IU/L 49 53 47  AST 0 - 40 IU/L _2 ALT 0 - 44 IU/L _3 Lipid Panel     Component Value Date/Time   CHOL 125 03/14/2018 1045   TRIG 77 03/14/2018 1045   HDL 43 03/14/2018 1045   CHOLHDL 2.9 03/14/2018 1045   CHOLHDL 5.0 Ratio 05/06/2010 2038   VLDL 37 05/06/2010 2038   LDLCALC 67 03/14/2018 1045    CBC    Component Value Date/Time   WBC 5.3 02/10/2019 1122   WBC 5.9 10/19/2007 2114   RBC 4.44 02/10/2019 1122   RBC 4.77 10/19/2007 2114   HGB 13.8 02/10/2019 1122   HCT 41.7 02/10/2019 1122   PLT 198 02/10/2019 1122   MCV 94 02/10/2019 1122   MCH 31.1 02/10/2019 1122   MCHC 33.1 02/10/2019 1122   MCHC 33.7 10/19/2007 2114   RDW 13.0 02/10/2019 1122   LYMPHSABS 2.8 02/10/2019 1122   MONOABS 1.0 10/19/2007 2114   EOSABS 0.5 (H) 02/10/2019 1122   BASOSABS 0.0 02/10/2019 1122    No results found for: HGBA1C  Assessment & Plan:   1. Hematuria, gross Urinalysis is negative for UTI but does reveal microscopic hematuria We will evaluate for renal stone We will also send of PSA in the light of history of prostate cancer We will refer to urology for cystoscopy if unrevealing. - POCT URINALYSIS DIP (CLINITEK) - Basic Metabolic Panel - PSA, total and free - CT RENAL STONE STUDY; Future  2. History of prostate cancer See #1 above - Basic Metabolic Panel - PSA, total and free  3. Essential hypertension Controlled Counseled on blood pressure goal of less than 130/80, low-sodium, DASH diet, medication compliance, 150 minutes of moderate intensity exercise per week. Discussed medication compliance, adverse effects. - amLODipine-benazepril (LOTREL) 10-40 MG capsule; Take 1 capsule by mouth daily.  Dispense: 90 capsule; Refill: 0  4. Chronic bilateral low back pain with right-sided sciatica Stable - gabapentin (NEURONTIN) 300 MG  capsule; Take 1 capsule (300 mg total) by mouth 3 (three) times daily.  Dispense: 270 capsule; Refill: 0   Return in about 3 months (around 01/18/2020) for Hypertension with PCP.    Charlott Rakes, MD, FAAFP. [facility] [place], [place]   10/18/2019, 11:55 AM

## 2019-10-18 NOTE — Progress Notes (Signed)
States that he saw a little blood in his urine.

## 2019-10-18 NOTE — Patient Instructions (Signed)
Hematuria, Adult Hematuria is blood in the urine. Blood may be visible in the urine, or it may be identified with a test. This condition can be caused by infections of the bladder, urethra, kidney, or prostate. Other possible causes include:  Kidney stones.  Cancer of the urinary tract.  Too much calcium in the urine.  Conditions that are passed from parent to child (inherited conditions).  Exercise that requires a lot of energy. Infections can usually be treated with medicine, and a kidney stone usually will pass through your urine. If neither of these is the cause of your hematuria, more tests may be needed to identify the cause of your symptoms. It is very important to tell your health care provider about any blood in your urine, even if it is painless or the blood stops without treatment. Blood in the urine, when it happens and then stops and then happens again, can be a symptom of a very serious condition, including cancer. There is no pain in the initial stages of many urinary cancers. Follow these instructions at home: Medicines  Take over-the-counter and prescription medicines only as told by your health care provider.  If you were prescribed an antibiotic medicine, take it as told by your health care provider. Do not stop taking the antibiotic even if you start to feel better. Eating and drinking  Drink enough fluid to keep your urine clear or pale yellow. It is recommended that you drink 3-4 quarts (2.8-3.8 L) a day. If you have been diagnosed with an infection, it is recommended that you drink cranberry juice in addition to large amounts of water.  Avoid caffeine, tea, and carbonated beverages. These tend to irritate the bladder.  Avoid alcohol because it may irritate the prostate (men). General instructions  If you have been diagnosed with a kidney stone, follow your health care provider's instructions about straining your urine to catch the stone.  Empty your bladder  often. Avoid holding urine for long periods of time.  If you are male: ? After a bowel movement, wipe from front to back and use each piece of toilet paper only once. ? Empty your bladder before and after sex.  Pay attention to any changes in your symptoms. Tell your health care provider about any changes or any new symptoms.  It is your responsibility to get your test results. Ask your health care provider, or the department performing the test, when your results will be ready.  Keep all follow-up visits as told by your health care provider. This is important. Contact a health care provider if:  You develop back pain.  You have a fever.  You have nausea or vomiting.  Your symptoms do not improve after 3 days.  Your symptoms get worse. Get help right away if:  You develop severe vomiting and are unable take medicine without vomiting.  You develop severe pain in your back or abdomen even though you are taking medicine.  You pass a large amount of blood in your urine.  You pass blood clots in your urine.  You feel very weak or like you might faint.  You faint. Summary  Hematuria is blood in the urine. It has many possible causes.  It is very important that you tell your health care provider about any blood in your urine, even if it is painless or the blood stops without treatment.  Take over-the-counter and prescription medicines only as told by your health care provider.  Drink enough fluid to keep   your urine clear or pale yellow. This information is not intended to replace advice given to you by your health care provider. Make sure you discuss any questions you have with your health care provider. Document Revised: 08/31/2018 Document Reviewed: 05/09/2016 Elsevier Patient Education  2020 [employer]vier Inc.  

## 2019-10-19 ENCOUNTER — Telehealth: Payer: Self-pay

## 2019-10-19 LAB — BASIC METABOLIC PANEL
BUN/Creatinine Ratio: 11 (ref 10–24)
BUN: 14 mg/dL (ref 8–27)
CO2: 25 mmol/L (ref 20–29)
Calcium: 9.3 mg/dL (ref 8.6–10.2)
Chloride: 104 mmol/L (ref 96–106)
Creatinine, Ser: 1.25 mg/dL (ref 0.76–1.27)
GFR calc Af Amer: 68 mL/min/{1.73_m2} (ref >59)
GFR calc non Af Amer: 59 mL/min/{1.73_m2} — ABNORMAL LOW (ref >59)
Glucose: 84 mg/dL (ref 65–99)
Potassium: 4.2 mmol/L (ref 3.5–5.2)
Sodium: 143 mmol/L (ref 134–144)

## 2019-10-19 LAB — PSA, TOTAL AND FREE
PSA, Free Pct: 14.4 %
PSA, Free: 0.46 ng/mL
Prostate Specific Ag, Serum: 3.2 ng/mL (ref 0.0–4.0)

## 2019-10-19 NOTE — Telephone Encounter (Signed)
-----   Message from Charlott Rakes, MD sent at 10/19/2019  8:39 AM EDT ----- Please inform the patient that labs are normal. Thank you.

## 2019-10-19 NOTE — Telephone Encounter (Signed)
Patient name and DOB has been verified Patient was informed of lab results. Patient had no questions.  

## 2019-10-25 ENCOUNTER — Other Ambulatory Visit: Payer: Self-pay

## 2019-10-25 ENCOUNTER — Ambulatory Visit (HOSPITAL_COMMUNITY)
Admission: RE | Admit: 2019-10-25 | Discharge: 2019-10-25 | Disposition: A | Discharge status: Home / Self Care | Payer: Medicare HMO | Source: Ambulatory Visit | Attending: Family Medicine | Admitting: Family Medicine

## 2019-10-25 DIAGNOSIS — R310 Gross hematuria: Secondary | ICD-10-CM | POA: Diagnosis not present

## 2019-10-27 ENCOUNTER — Telehealth: Payer: Self-pay

## 2019-10-27 NOTE — Telephone Encounter (Signed)
Patient name and DOB has been verified Patient was informed of lab results. Patient had no questions.  

## 2019-10-27 NOTE — Telephone Encounter (Signed)
-----   Message from Charlott Rakes, MD sent at 10/26/2019  6:26 PM EDT ----- CT scan reveals presence of R kidney stone which can explain the blood in his urine. Increased hydration will facilitate passage of the stone.

## 2020-01-30 ENCOUNTER — Telehealth: Payer: Self-pay | Admitting: Family Medicine

## 2020-01-30 ENCOUNTER — Other Ambulatory Visit: Payer: Self-pay | Admitting: Family Medicine

## 2020-01-30 DIAGNOSIS — I10 Essential (primary) hypertension: Secondary | ICD-10-CM

## 2020-01-30 MED ORDER — AMLODIPINE BESY-BENAZEPRIL HCL 10-40 MG PO CAPS: 1.0000 | ORAL_CAPSULE | Freq: Every day | ORAL | 0 refills | Status: DC

## 2020-01-30 NOTE — Progress Notes (Signed)
Patient ID: Timothy [name], male   DOB: 04/20/1951, 69 y.o.   MRN: [id number]   Patient has appointment at the end of the month and left phone message that he would need refill of his amlodipine prior to the appointment.  Prescription will be sent to [facility] for amlodipine-benazepril so that patient will not run out prior to his appointment.

## 2020-01-30 NOTE — Telephone Encounter (Signed)
Pt has appt for med refill on 10/27. Can script be sent for this to make it to appt?

## 2020-01-30 NOTE — Telephone Encounter (Signed)
Will route to PCP for review.  Has appointment on 02/14/2020

## 2020-01-30 NOTE — Telephone Encounter (Signed)
Notify patient that a refill of his blood pressure medication was sent to [facility].

## 2020-01-30 NOTE — Telephone Encounter (Signed)
PT needs refill of Amlodipine at [facility]

## 2020-02-14 ENCOUNTER — Ambulatory Visit: Payer: Medicare HMO | Attending: Family Medicine | Admitting: Family Medicine

## 2020-02-14 ENCOUNTER — Encounter: Payer: Self-pay | Admitting: Family Medicine

## 2020-02-14 ENCOUNTER — Other Ambulatory Visit: Payer: Self-pay

## 2020-02-14 VITALS — BP 149/88 | HR 73 | Temp 97.2°F | Ht 67.0 in | Wt 136.8 lb

## 2020-02-14 DIAGNOSIS — I10 Essential (primary) hypertension: Secondary | ICD-10-CM | POA: Diagnosis not present

## 2020-02-14 DIAGNOSIS — Z23 Encounter for immunization: Secondary | ICD-10-CM | POA: Diagnosis not present

## 2020-02-14 DIAGNOSIS — R319 Hematuria, unspecified: Secondary | ICD-10-CM | POA: Diagnosis not present

## 2020-02-14 DIAGNOSIS — M5441 Lumbago with sciatica, right side: Secondary | ICD-10-CM

## 2020-02-14 DIAGNOSIS — G8929 Other chronic pain: Secondary | ICD-10-CM

## 2020-02-14 NOTE — Progress Notes (Signed)
R KIDNEY STONE -CT SCAN DISC DETERIOTING IN BACK CAUSING PAIN

## 2020-02-16 ENCOUNTER — Other Ambulatory Visit: Payer: Self-pay | Admitting: Family Medicine

## 2020-02-16 DIAGNOSIS — R319 Hematuria, unspecified: Secondary | ICD-10-CM

## 2020-02-16 LAB — URINE CULTURE

## 2020-02-16 MED ORDER — SULFAMETHOXAZOLE-TRIMETHOPRIM 800-160 MG PO TABS: 1.0000 | ORAL_TABLET | Freq: Two times a day (BID) | ORAL | 0 refills | Status: AC

## 2020-03-13 ENCOUNTER — Encounter: Payer: Self-pay | Admitting: Family Medicine

## 2020-03-13 NOTE — Progress Notes (Signed)
Established Patient Office Visit  Subjective:  Patient ID: Timothy [name], male    DOB: [birth year]  Age: 69 y.o. MRN: [id number]  CC:  Chief Complaint  Patient presents with  . Follow-up    KIDNEY STONES    HPI Timothy [name], 69 year old male, who is seen in follow-up of office visit on 10/18/2019 with another provider which time patient was seen due to gross hematuria 4 days prior to his appointment.  Per his visit note, patient was scheduled for renal stone protocol CT scan and urology referral.  Patient has not noticed any further gross hematuria.  He was notified of the presence of a right-sided kidney stone.  He does not believe that he is having any acute increase in his chronic back pain.  He continues to remain compliant with his blood pressure medications.  Past Medical History:  Diagnosis Date  . Chronic kidney disease (CKD), stage II (mild)   . Essential hypertension   . Low back pain with radiation   . Personal history of prostate cancer     Past Surgical History:  Procedure Laterality Date  . PROSTATE SURGERY      Family History  Problem Relation Age of Onset  . Hypertension Mother   . Cancer Neg Hx   . Diabetes Neg Hx   . CVA Neg Hx     Social History   Socioeconomic History  . Marital status: Single    Spouse name: Not on file  . Number of children: Not on file  . Years of education: Not on file  . Highest education level: Not on file  Occupational History  . Not on file  Tobacco Use  . Smoking status: Current Some Day Smoker    Types: Cigars  . Smokeless tobacco: Never Used  Vaping Use  . Vaping Use: Never used  Substance and Sexual Activity  . Alcohol use: Yes  . Drug use: Yes    Types: Marijuana  . Sexual activity: Not Currently  Other Topics Concern  . Not on file  Social History Narrative  . Not on file   Social Determinants of Health   Financial Resource Strain:   . Difficulty of Paying Living Expenses: Not on file  Food  Insecurity:   . Worried About Charity fundraiser in the Last Year: Not on file  . Ran Out of Food in the Last Year: Not on file  Transportation Needs:   . Lack of Transportation (Medical): Not on file  . Lack of Transportation (Non-Medical): Not on file  Physical Activity:   . Days of Exercise per Week: Not on file  . Minutes of Exercise per Session: Not on file  Stress:   . Feeling of Stress : Not on file  Social Connections:   . Frequency of Communication with Friends and Family: Not on file  . Frequency of Social Gatherings with Friends and Family: Not on file  . Attends Religious Services: Not on file  . Active Member of Clubs or Organizations: Not on file  . Attends Archivist Meetings: Not on file  . Marital Status: Not on file  Intimate Partner Violence:   . Fear of Current or Ex-Partner: Not on file  . Emotionally Abused: Not on file  . Physically Abused: Not on file  . Sexually Abused: Not on file    Outpatient Medications Prior to Visit  Medication Sig Dispense Refill  . amLODipine-benazepril (LOTREL) 10-40 MG capsule Take 1 capsule by  mouth daily. 90 capsule 0  . baclofen (LIORESAL) 10 MG tablet Take 10 mg by mouth 3 (three) times daily.    Marland Kitchen gabapentin (NEURONTIN) 300 MG capsule Take 1 capsule (300 mg total) by mouth 3 (three) times daily. 270 capsule 0  . HYDROcodone-acetaminophen (NORCO/VICODIN) 5-325 MG tablet     . meloxicam (MOBIC) 7.5 MG tablet Take 2 tablets (15 mg total) by mouth daily. 60 tablet 3  . NARCAN 4 MG/0.1ML LIQD nasal spray kit      No facility-administered medications prior to visit.    No Known Allergies  ROS Review of Systems  Constitutional: Positive for fatigue (Mild, chronic). Negative for chills and fever.  Eyes: Negative for photophobia and visual disturbance.  Respiratory: Negative for cough and shortness of breath.   Cardiovascular: Negative for chest pain and palpitations.  Gastrointestinal: Negative for abdominal  pain, constipation, diarrhea and nausea.  Endocrine: Negative for polydipsia, polyphagia and polyuria.  Genitourinary: Negative for difficulty urinating, dysuria, frequency and hematuria.  Musculoskeletal: Positive for back pain and gait problem.  Neurological: Negative for dizziness and headaches.  Hematological: Negative for adenopathy. Does not bruise/bleed easily.      Objective:    Physical Exam Vitals and nursing note reviewed.  Constitutional:      Appearance: Normal appearance.  Cardiovascular:     Rate and Rhythm: Normal rate and regular rhythm.  Pulmonary:     Effort: Pulmonary effort is normal.     Breath sounds: Normal breath sounds.  Abdominal:     Palpations: Abdomen is soft.     Tenderness: There is no abdominal tenderness. There is no right CVA tenderness, left CVA tenderness, guarding or rebound.  Musculoskeletal:        General: Tenderness (Lower lumbar discomfort to palpation and thoracolumbar paraspinous spasm) present.     Right lower leg: No edema.     Left lower leg: No edema.  Skin:    General: Skin is warm and dry.  Neurological:     General: No focal deficit present.     Mental Status: He is alert and oriented to person, place, and time.     BP (!) 149/88 (BP Location: Left Arm, Patient Position: Sitting)   Pulse 73   Temp (!) 97.2 F (36.2 C)   Ht '5\' 7"'  (1.702 m)   Wt 136 lb 12.8 oz (62.1 kg)   SpO2 95%   BMI 21.43 kg/m  Wt Readings from Last 3 Encounters:  02/14/20 136 lb 12.8 oz (62.1 kg)  10/18/19 133 lb (60.3 kg)  02/10/19 135 lb 12.8 oz (61.6 kg)     Health Maintenance Due  Topic Date Due  . Hepatitis C Screening  Never done  . COVID-19 Vaccine (1) Never done  . COLONOSCOPY  11/05/2013  . PNA vac Low Risk Adult (2 of 2 - PCV13) 11/23/2018    There are no preventive care reminders to display for this patient.  Lab Results  Component Value Date   TSH 0.808 10/19/2007   Lab Results  Component Value Date   WBC 5.3  02/10/2019   HGB 13.8 02/10/2019   HCT 41.7 02/10/2019   MCV 94 02/10/2019   PLT 198 02/10/2019   Lab Results  Component Value Date   NA 143 10/18/2019   K 4.2 10/18/2019   CO2 25 10/18/2019   GLUCOSE 84 10/18/2019   BUN 14 10/18/2019   CREATININE 1.25 10/18/2019   BILITOT 0.5 02/10/2019   ALKPHOS 49 02/10/2019  AST 20 02/10/2019   ALT 13 02/10/2019   PROT 7.4 02/10/2019   ALBUMIN 4.5 02/10/2019   CALCIUM 9.3 10/18/2019   Lab Results  Component Value Date   CHOL 125 03/14/2018   Lab Results  Component Value Date   HDL 43 03/14/2018   Lab Results  Component Value Date   LDLCALC 67 03/14/2018   Lab Results  Component Value Date   TRIG 77 03/14/2018   Lab Results  Component Value Date   CHOLHDL 2.9 03/14/2018   No results found for: HGBA1C    Assessment & Plan:  1. Hematuria, unspecified type Patient with history of kidney stones and has had stone protocol renal CT scan 10/25/2019 with a 3 mm nonobstructive calculus in the upper pole collecting system of the right kidney.  Patient will have urinalysis to look for possible urinary tract infection/hematuria and urine will be sent for culture.  He is encouraged to follow-up with urology. - POCT URINALYSIS DIP (CLINITEK) - Urine Culture  2. Essential hypertension Continue use of amlodipine-benazepril, 10-40 mg daily along with a low-sodium diet.  On review of chart, patient with basic metabolic panel 0/73/5430 with creatinine of 1.25 and normal EGFR of 68.  3. Chronic bilateral low back pain with right-sided sciatica Patient's back pain is being treated by pain medicine clinic.  4. Need for immunization against influenza Influenza immunization provided at today's visit along with educational material regarding the influenza immunization - Flu Vaccine QUAD 36+ mos IM     Follow-up: Chronic issues, 4 months, sooner if needed   Antony Blackbird, MD

## 2020-04-30 ENCOUNTER — Other Ambulatory Visit: Payer: Self-pay | Admitting: Family Medicine

## 2020-04-30 DIAGNOSIS — I10 Essential (primary) hypertension: Secondary | ICD-10-CM

## 2020-04-30 MED ORDER — AMLODIPINE BESY-BENAZEPRIL HCL 10-40 MG PO CAPS: 1.0000 | ORAL_CAPSULE | Freq: Every day | ORAL | 0 refills | Status: DC

## 2020-04-30 NOTE — Telephone Encounter (Signed)
Copied from [place] [id number]. Topic: Quick Communication - Rx Refill/Question >> Apr 30, 2020  9:13 AM Leward Quan A wrote: Medication: amLODipine-benazepril (LOTREL) 10-40 MG capsule   Has the patient contacted their pharmacy? Yes.   (Agent: If no, request that the patient contact the pharmacy for the refill.) (Agent: If yes, when and what did the pharmacy advise?)  Preferred Pharmacy (with phone number or street name): [facility] [place] [facility] [place]  Phone:  [phone] Fax:  [phone]     Agent: Please be advised that RX refills may take up to 3 business days. We ask that you follow-up with your pharmacy.

## 2020-05-01 ENCOUNTER — Ambulatory Visit: Payer: Medicare HMO | Attending: Family Medicine | Admitting: Family Medicine

## 2020-05-01 ENCOUNTER — Other Ambulatory Visit: Payer: Self-pay

## 2020-05-01 DIAGNOSIS — I10 Essential (primary) hypertension: Secondary | ICD-10-CM | POA: Diagnosis not present

## 2020-05-01 DIAGNOSIS — G8929 Other chronic pain: Secondary | ICD-10-CM | POA: Diagnosis not present

## 2020-05-01 DIAGNOSIS — M5441 Lumbago with sciatica, right side: Secondary | ICD-10-CM

## 2020-05-01 MED ORDER — GABAPENTIN 300 MG PO CAPS: 300.0000 mg | ORAL_CAPSULE | Freq: Three times a day (TID) | ORAL | 1 refills | Status: AC | PRN

## 2020-05-01 MED ORDER — AMLODIPINE BESY-BENAZEPRIL HCL 10-40 MG PO CAPS: 1.0000 | ORAL_CAPSULE | Freq: Every day | ORAL | 6 refills | Status: DC

## 2020-05-01 NOTE — Progress Notes (Signed)
Virtual Visit via Telephone Note  I connected with Timothy [name], on 05/01/2020 at 11:23 AM by telephone due to the COVID-19 pandemic and verified that I am speaking with the correct person using two identifiers.   Consent: I discussed the limitations, risks, security and privacy concerns of performing an evaluation and management service by telephone and the availability of in person appointments. I also discussed with the patient that there may be a patient responsible charge related to this service. The patient expressed understanding and agreed to proceed.   Location of Patient: Out and about  Location of Provider: Home   Persons participating in Telemedicine visit: [name] Dr. Margarita Rana     History of Present Illness: 70 year old male with a history of Hypertension, chronic low back pain, history of Prostate ca seen to establish care.   He has chronic low back pain which radiates down his R leg for which he takes Gabapentin and is requesting a refill. Denies loss of sphincteric function or recent falls. Compliant with his antihypertensive and has no additional concerns today The hematuria he previously had has resolved. Past Medical History:  Diagnosis Date  . Chronic kidney disease (CKD), stage II (mild)   . Essential hypertension   . Low back pain with radiation   . Personal history of prostate cancer    No Known Allergies  Current Outpatient Medications on File Prior to Visit  Medication Sig Dispense Refill  . amLODipine-benazepril (LOTREL) 10-40 MG capsule Take 1 capsule by mouth daily. 30 capsule 0  . baclofen (LIORESAL) 10 MG tablet Take 10 mg by mouth 3 (three) times daily.    Marland Kitchen gabapentin (NEURONTIN) 300 MG capsule Take 1 capsule (300 mg total) by mouth 3 (three) times daily. 270 capsule 0  . HYDROcodone-acetaminophen (NORCO/VICODIN) 5-325 MG tablet     . meloxicam (MOBIC) 7.5 MG tablet Take 2 tablets (15 mg total) by mouth daily.  60 tablet 3  . NARCAN 4 MG/0.1ML LIQD nasal spray kit      No current facility-administered medications on file prior to visit.    Observations/Objective: Awake, alert, oriented x3 Not in acute distress  Assessment and Plan: 1. Essential hypertension Controlled Counseled on blood pressure goal of less than 130/80, low-sodium, DASH diet, medication compliance, 150 minutes of moderate intensity exercise per week. Discussed medication compliance, adverse effects. - amLODipine-benazepril (LOTREL) 10-40 MG capsule; Take 1 capsule by mouth daily.  Dispense: 30 capsule; Refill: 6 - Basic Metabolic Panel; Future  2. Chronic bilateral low back pain with right-sided sciatica Stable - gabapentin (NEURONTIN) 300 MG capsule; Take 1 capsule (300 mg total) by mouth 3 (three) times daily as needed.  Dispense: 270 capsule; Refill: 1   Follow Up Instructions: 6 months   I discussed the assessment and treatment plan with the patient. The patient was provided an opportunity to ask questions and all were answered. The patient agreed with the plan and demonstrated an understanding of the instructions.   The patient was advised to call back or seek an in-person evaluation if the symptoms worsen or if the condition fails to improve as anticipated.     I provided 11 minutes total of non-face-to-face time during this encounter including median intraservice time, reviewing previous notes, investigations, ordering medications, medical decision making, coordinating care and patient verbalized understanding at the end of the visit.     Charlott Rakes, MD, FAAFP. [facility] and [facility] [place], [place]  05/01/2020, 11:23 AM

## 2020-05-02 ENCOUNTER — Ambulatory Visit: Payer: Medicare HMO | Attending: Family Medicine

## 2020-05-02 ENCOUNTER — Other Ambulatory Visit: Payer: Self-pay

## 2020-05-02 DIAGNOSIS — I10 Essential (primary) hypertension: Secondary | ICD-10-CM

## 2020-05-03 ENCOUNTER — Telehealth: Payer: Self-pay

## 2020-05-03 LAB — BASIC METABOLIC PANEL
BUN/Creatinine Ratio: 14 (ref 10–24)
BUN: 18 mg/dL (ref 8–27)
CO2: 25 mmol/L (ref 20–29)
Calcium: 9.4 mg/dL (ref 8.6–10.2)
Chloride: 104 mmol/L (ref 96–106)
Creatinine, Ser: 1.27 mg/dL (ref 0.76–1.27)
GFR calc Af Amer: 67 mL/min/{1.73_m2} (ref >59)
GFR calc non Af Amer: 58 mL/min/{1.73_m2} — ABNORMAL LOW (ref >59)
Glucose: 81 mg/dL (ref 65–99)
Potassium: 3.7 mmol/L (ref 3.5–5.2)
Sodium: 142 mmol/L (ref 134–144)

## 2020-05-03 NOTE — Telephone Encounter (Signed)
Contacted pt go over lab pt is aware and doesn't have any questions or concerns

## 2020-07-21 ENCOUNTER — Other Ambulatory Visit: Payer: Self-pay

## 2020-07-21 ENCOUNTER — Emergency Department (HOSPITAL_COMMUNITY)
Admission: EM | Admit: 2020-07-21 | Discharge: 2020-07-21 | Disposition: A | Discharge status: Home / Self Care | Payer: Medicare HMO | Attending: Emergency Medicine | Admitting: Emergency Medicine

## 2020-07-21 ENCOUNTER — Encounter (HOSPITAL_COMMUNITY): Payer: Self-pay | Admitting: Emergency Medicine

## 2020-07-21 DIAGNOSIS — F17290 Nicotine dependence, other tobacco product, uncomplicated: Secondary | ICD-10-CM | POA: Insufficient documentation

## 2020-07-21 DIAGNOSIS — N182 Chronic kidney disease, stage 2 (mild): Secondary | ICD-10-CM | POA: Insufficient documentation

## 2020-07-21 DIAGNOSIS — I129 Hypertensive chronic kidney disease with stage 1 through stage 4 chronic kidney disease, or unspecified chronic kidney disease: Secondary | ICD-10-CM | POA: Diagnosis not present

## 2020-07-21 DIAGNOSIS — Z8546 Personal history of malignant neoplasm of prostate: Secondary | ICD-10-CM | POA: Insufficient documentation

## 2020-07-21 DIAGNOSIS — Z79899 Other long term (current) drug therapy: Secondary | ICD-10-CM | POA: Insufficient documentation

## 2020-07-21 DIAGNOSIS — I951 Orthostatic hypotension: Secondary | ICD-10-CM

## 2020-07-21 DIAGNOSIS — R55 Syncope and collapse: Secondary | ICD-10-CM | POA: Diagnosis present

## 2020-07-21 LAB — CBC WITH DIFFERENTIAL/PLATELET
Abs Immature Granulocytes: 0.02 10*3/uL (ref 0.00–0.07)
Basophils Absolute: 0 10*3/uL (ref 0.0–0.1)
Basophils Relative: 0 %
Eosinophils Absolute: 0.2 10*3/uL (ref 0.0–0.5)
Eosinophils Relative: 3 %
HCT: 36.6 % — ABNORMAL LOW (ref 39.0–52.0)
Hemoglobin: 12.4 g/dL — ABNORMAL LOW (ref 13.0–17.0)
Immature Granulocytes: 0 %
Lymphocytes Relative: 22 %
Lymphs Abs: 1.5 10*3/uL (ref 0.7–4.0)
MCH: 32 pg (ref 26.0–34.0)
MCHC: 33.9 g/dL (ref 30.0–36.0)
MCV: 94.6 fL (ref 80.0–100.0)
Monocytes Absolute: 0.9 10*3/uL (ref 0.1–1.0)
Monocytes Relative: 14 %
Neutro Abs: 4.2 10*3/uL (ref 1.7–7.7)
Neutrophils Relative %: 61 %
Platelets: 186 10*3/uL (ref 150–400)
RBC: 3.87 MIL/uL — ABNORMAL LOW (ref 4.22–5.81)
RDW: 14.3 % (ref 11.5–15.5)
WBC: 6.8 10*3/uL (ref 4.0–10.5)
nRBC: 0 % (ref 0.0–0.2)

## 2020-07-21 LAB — URINALYSIS, ROUTINE W REFLEX MICROSCOPIC
Bilirubin Urine: NEGATIVE
Glucose, UA: NEGATIVE mg/dL
Hgb urine dipstick: NEGATIVE
Ketones, ur: 5 mg/dL — AB
Leukocytes,Ua: NEGATIVE
Nitrite: NEGATIVE
Protein, ur: 30 mg/dL — AB
Specific Gravity, Urine: 1.02 (ref 1.005–1.030)
pH: 5 (ref 5.0–8.0)

## 2020-07-21 LAB — BASIC METABOLIC PANEL
Anion gap: 9 (ref 5–15)
BUN: 18 mg/dL (ref 8–23)
CO2: 27 mmol/L (ref 22–32)
Calcium: 9.1 mg/dL (ref 8.9–10.3)
Chloride: 100 mmol/L (ref 98–111)
Creatinine, Ser: 1.66 mg/dL — ABNORMAL HIGH (ref 0.61–1.24)
GFR, Estimated: 45 mL/min — ABNORMAL LOW (ref >60)
Glucose, Bld: 120 mg/dL — ABNORMAL HIGH (ref 70–99)
Potassium: 3.6 mmol/L (ref 3.5–5.1)
Sodium: 136 mmol/L (ref 135–145)

## 2020-07-21 MED ORDER — SODIUM CHLORIDE 0.9 % IV BOLUS
1000.0000 mL | Freq: Once | INTRAVENOUS | Status: AC
  Administered 2020-07-21: 1000 mL via INTRAVENOUS

## 2020-07-21 NOTE — ED Provider Notes (Signed)
Patient placed in Quick Look pathway, seen and evaluated   Chief Complaint: Syncopal episode  HPI: Patient states that he stood up out of a car today and passed out.  No preceding chest pain, headache, shortness of breath.  He is otherwise feeling well.  No nausea, vomiting, or diarrhea.  ROS: Syncope  Physical Exam:   Gen: No distress  Neuro: Awake and Alert  Skin: Warm   BP 114/76   Pulse 87   Temp 98.2 F (36.8 C)   Resp 16   SpO2 96%    Initiation of care has begun. The patient has been counseled on the process, plan, and necessity for staying for the completion/evaluation, and the remainder of the medical screening examination    Carlisle Cater, Hershal Coria 07/21/20 1910    Lucrezia Starch, MD 07/22/20 [phone]

## 2020-07-21 NOTE — ED Triage Notes (Addendum)
Patient reports LOC after standing for a period of time at a funeral. Reports feeling weak. Denies N/V/D.

## 2020-07-21 NOTE — Discharge Instructions (Addendum)
You have been evaluated for your passing out episode.  This is likely due to dehydration.  Please drink plenty of fluid, follow-up closely with your doctor further further care.

## 2020-07-21 NOTE — ED Provider Notes (Signed)
[facility] DEPT Provider Note   CSN: [id number] Arrival date & time: 07/21/20  1840     History Chief Complaint  Patient presents with  . Loss of Consciousness    Timothy [name] is a 70 y.o. male.  The history is provided by the patient and medical records. No language interpreter was used.  Loss of Consciousness    70 year old male significant history of chronic kidney disease, hypertension, prostate cancer, presents ED for evaluation of a recent syncopal episode.  Patient states he was at a funeral earlier today.  He was standing for a few hours.  Later on he went out side and felt a bit lightheadedness and dizzy.  He went to sit in the car and when he stood up to get out he had a brief syncopal episode.  States family member was around him and he did not fall or hitting his head.  Episodes was transient.  No associated pain.  No headache, confusion, neck pain, chest pain, trouble breathing, abdominal pain, back pain, focal numbness or focal weakness.  No recent medication changes.  Has been eating and drinking fine no nausea vomiting or diarrhea.  He did have 1 similar episode like this several years ago.  Past Medical History:  Diagnosis Date  . Chronic kidney disease (CKD), stage II (mild)   . Essential hypertension   . Low back pain with radiation   . Personal history of prostate cancer     Patient Active Problem List   Diagnosis Date Noted  . Essential hypertension 07/14/2018  . Benign hypertension with chronic kidney disease, stage II 07/14/2018  . Hematuria, gross 01/31/2018  . Scalp lesion 08/20/2017  . History of prostate cancer 07/07/2017  . Chronic bilateral low back pain with right-sided sciatica 07/07/2017  . History of hypertension 07/07/2017    Past Surgical History:  Procedure Laterality Date  . PROSTATE SURGERY         Family History  Problem Relation Age of Onset  . Hypertension Mother   . Cancer Neg Hx   .  Diabetes Neg Hx   . CVA Neg Hx     Social History   Tobacco Use  . Smoking status: Current Some Day Smoker    Types: Cigars  . Smokeless tobacco: Never Used  Vaping Use  . Vaping Use: Never used  Substance Use Topics  . Alcohol use: Yes  . Drug use: Yes    Types: Marijuana    Home Medications Prior to Admission medications   Medication Sig Start Date End Date Taking? Authorizing Provider  amLODipine-benazepril (LOTREL) 10-40 MG capsule Take 1 capsule by mouth daily. 05/01/20   Charlott Rakes, MD  baclofen (LIORESAL) 10 MG tablet Take 10 mg by mouth 3 (three) times daily.    [provider]  gabapentin (NEURONTIN) 300 MG capsule Take 1 capsule (300 mg total) by mouth 3 (three) times daily as needed. 05/01/20   Charlott Rakes, MD  HYDROcodone-acetaminophen (NORCO/VICODIN) 5-325 MG tablet  06/16/18   [provider]  meloxicam (MOBIC) 7.5 MG tablet Take 2 tablets (15 mg total) by mouth daily. 12/29/18   Argentina Donovan, PA-C  NARCAN 4 MG/0.1ML LIQD nasal spray kit  06/16/18   [provider]    Allergies    Patient has no known allergies.  Review of Systems   Review of Systems  Cardiovascular: Positive for syncope.  All other systems reviewed and are negative.   Physical Exam Updated Vital Signs  BP 114/76   Pulse 87   Temp 98.2 F (36.8 C)   Resp 16   Ht '5\' 7"'  (1.702 m)   Wt 61.7 kg   SpO2 96%   BMI 21.30 kg/m   Physical Exam Vitals and nursing note reviewed.  Constitutional:      General: He is not in acute distress.    Appearance: He is well-developed.  HENT:     Head: Atraumatic.  Eyes:     Conjunctiva/sclera: Conjunctivae normal.  Cardiovascular:     Rate and Rhythm: Normal rate and regular rhythm.     Pulses: Normal pulses.     Heart sounds: Normal heart sounds. No murmur heard. No friction rub. No gallop.   Pulmonary:     Effort: Pulmonary effort is normal.     Breath sounds: Normal breath sounds. No wheezing, rhonchi or  rales.  Abdominal:     General: Abdomen is flat.     Palpations: Abdomen is soft.     Tenderness: There is no abdominal tenderness.  Musculoskeletal:     Cervical back: Neck supple.  Skin:    Findings: No rash.  Neurological:     Mental Status: He is alert and oriented to person, place, and time.     GCS: GCS eye subscore is 4. GCS verbal subscore is 5. GCS motor subscore is 6.     Cranial Nerves: Cranial nerves are intact.     Sensory: Sensation is intact.     Motor: Motor function is intact.     ED Results / Procedures / Treatments   Labs (all labs ordered are listed, but only abnormal results are displayed) Labs Reviewed  CBC WITH DIFFERENTIAL/PLATELET - Abnormal; Notable for the following components:      Result Value   RBC 3.87 (*)    Hemoglobin 12.4 (*)    HCT 36.6 (*)    All other components within normal limits  BASIC METABOLIC PANEL - Abnormal; Notable for the following components:   Glucose, Bld 120 (*)    Creatinine, Ser 1.66 (*)    GFR, Estimated 45 (*)    All other components within normal limits  URINALYSIS, ROUTINE W REFLEX MICROSCOPIC - Abnormal; Notable for the following components:   Ketones, ur 5 (*)    Protein, ur 30 (*)    Bacteria, UA RARE (*)    All other components within normal limits    EKG EKG Interpretation  Date/Time:  Sunday July 21 2020 18:48:26 EDT Ventricular Rate:  81 PR Interval:  149 QRS Duration: 105 QT Interval:  389 QTC Calculation: 452 R Axis:   -60 Text Interpretation: Sinus rhythm Right atrial enlargement Left anterior fascicular block Abnormal R-wave progression, early transition Probable left ventricular hypertrophy Baseline wander in lead(s) V1 12 Lead; Mason-Likar since last tracing no significant change Confirmed by Malvin Johns [phone]) on 07/21/2020 10:48:24 PM   ED ECG REPORT   Date: 07/21/2020  Rate: 81  Rhythm: normal sinus rhythm  QRS Axis: left  Intervals: normal  ST/T Wave abnormalities: normal   Conduction Disutrbances:left anterior fascicular block  Narrative Interpretation:   Old EKG Reviewed: unchanged  I have personally reviewed the EKG tracing and agree with the computerized printout as noted.   Radiology No results found.  Procedures Procedures   Medications Ordered in ED Medications  sodium chloride 0.9 % bolus 1,000 mL (has no administration in time range)    ED Course  I have reviewed the triage vital signs and the  nursing notes.  Pertinent labs & imaging results that were available during my care of the patient were reviewed by me and considered in my medical decision making (see chart for details).    MDM Rules/Calculators/A&P                          BP 115/79 (BP Location: Right Arm)   Pulse 67   Temp 98.2 F (36.8 C)   Resp 14   Ht '5\' 7"'  (1.702 m)   Wt 61.7 kg   SpO2 98%   BMI 21.30 kg/m   Final Clinical Impression(s) / ED Diagnoses Final diagnoses:  Syncope due to orthostatic hypotension    Rx / DC Orders ED Discharge Orders    None     9:40 PM Patient had a witnessed syncopal episode that was transient and happened earlier today.  He did report feeling a bit lightheadedness prior to that.  Symptom is suggestive of orthostatic syncope.  Does have history of chronic kidney disease, currently his creatinine is 1.66, a bit elevated from his baseline.  EKG without concerning arrhythmia or ischemic changes, hemoglobin is 12.4, UA without signs of urinary tract infection.  Blood pressure is 115/79 and we did perform orthostatic vitals with increased heart rate.  We will give IV fluid and monitor closely.  At this time patient is resting comfortably and in no acute discomfort.  Care discussed with Dr. Tamera Punt  10:57 PM Patient received IV fluid, felt better, ambulated without difficulty, at this time he is stable for discharge.   Domenic Moras, PA-C 07/21/20 2259    Malvin Johns, MD 07/21/20 [phone]

## 2020-07-21 NOTE — ED Notes (Addendum)
Pt ambulated well unassisted down the hall, no lightheadedness, no pain.

## 2020-07-22 ENCOUNTER — Ambulatory Visit: Payer: Medicare HMO | Admitting: Physician Assistant

## 2020-07-22 VITALS — BP 143/87 | HR 84 | Temp 98.2°F | Resp 18 | Ht 66.0 in | Wt 136.0 lb

## 2020-07-22 DIAGNOSIS — R630 Anorexia: Secondary | ICD-10-CM | POA: Insufficient documentation

## 2020-07-22 DIAGNOSIS — Z13228 Encounter for screening for other metabolic disorders: Secondary | ICD-10-CM

## 2020-07-22 DIAGNOSIS — R634 Abnormal weight loss: Secondary | ICD-10-CM | POA: Diagnosis not present

## 2020-07-22 DIAGNOSIS — R55 Syncope and collapse: Secondary | ICD-10-CM | POA: Diagnosis not present

## 2020-07-22 MED ORDER — CYPROHEPTADINE HCL 4 MG PO TABS: ORAL_TABLET | ORAL | 0 refills | Status: DC

## 2020-07-22 NOTE — Patient Instructions (Signed)
To help stimulate your appetite, you will take cyproheptadine up to every 8 hours.  I would start at once a day and then after a few days if you have not noticed an improvement, go ahead and increase to twice a day.  I encourage you to increase your hydration, drink at least 5 bottles of water a day.  I encourage you to increase your caloric intake approximately 1000 to 1500 cal a day making sure to include protein and good fats.  Please return to the mobile unit in 2 weeks for follow-up.  Kennieth Rad, PA-C Physician Assistant [facility] [email or web address]    Healthy Eating Following a healthy eating pattern may help you to achieve and maintain a healthy body weight, reduce the risk of chronic disease, and live a long and productive life. It is important to follow a healthy eating pattern at an appropriate calorie level for your body. Your nutritional needs should be met primarily through food by choosing a variety of nutrient-rich foods. What are tips for following this plan? Reading food labels  Read labels and choose the following: ? Reduced or low sodium. ? Juices with 100% fruit juice. ? Foods with low saturated fats and high polyunsaturated and monounsaturated fats. ? Foods with whole grains, such as whole wheat, cracked wheat, brown rice, and wild rice. ? Whole grains that are fortified with folic acid. This is recommended for women who are pregnant or who want to become pregnant.  Read labels and avoid the following: ? Foods with a lot of added sugars. These include foods that contain brown sugar, corn sweetener, corn syrup, dextrose, fructose, glucose, high-fructose corn syrup, honey, invert sugar, lactose, malt syrup, maltose, molasses, raw sugar, sucrose, trehalose, or turbinado sugar.  Do not eat more than the following amounts of added sugar per day:  6 teaspoons (25 g) for women.  9 teaspoons (38 g) for  men. ? Foods that contain processed or refined starches and grains. ? Refined grain products, such as white flour, degermed cornmeal, white bread, and white rice. Shopping  Choose nutrient-rich snacks, such as vegetables, whole fruits, and nuts. Avoid high-calorie and high-sugar snacks, such as potato chips, fruit snacks, and candy.  Use oil-based dressings and spreads on foods instead of solid fats such as butter, stick margarine, or cream cheese.  Limit pre-made sauces, mixes, and "instant" products such as flavored rice, instant noodles, and ready-made pasta.  Try more plant-protein sources, such as tofu, tempeh, black beans, edamame, lentils, nuts, and seeds.  Explore eating plans such as the Mediterranean diet or vegetarian diet. Cooking  Use oil to saut or stir-fry foods instead of solid fats such as butter, stick margarine, or lard.  Try baking, boiling, grilling, or broiling instead of frying.  Remove the fatty part of meats before cooking.  Steam vegetables in water or broth. Meal planning  At meals, imagine dividing your plate into fourths: ? One-half of your plate is fruits and vegetables. ? One-fourth of your plate is whole grains. ? One-fourth of your plate is protein, especially lean meats, poultry, eggs, tofu, beans, or nuts.  Include low-fat dairy as part of your daily diet.   Lifestyle  Choose healthy options in all settings, including home, work, school, restaurants, or stores.  Prepare your food safely: ? Wash your hands after handling raw meats. ? Keep food preparation surfaces clean by regularly washing with hot, soapy water. ? Keep raw meats separate from ready-to-eat foods, such as fruits and  vegetables. ? Cook seafood, meat, poultry, and eggs to the recommended internal temperature. ? Store foods at safe temperatures. In general:  Keep cold foods at 9F (4.4C) or below.  Keep hot foods at 19F (60C) or above.  Keep your freezer at [facility]  (-17.8C) or below.  Foods are no longer safe to eat when they have been between the temperatures of 40-19F (4.4-60C) for more than 2 hours. What foods should I eat? Fruits Aim to eat 2 cup-equivalents of fresh, canned (in natural juice), or frozen fruits each day. Examples of 1 cup-equivalent of fruit include 1 small apple, 8 large strawberries, 1 cup canned fruit,  cup dried fruit, or 1 cup 100% juice. Vegetables Aim to eat 2-3 cup-equivalents of fresh and frozen vegetables each day, including different varieties and colors. Examples of 1 cup-equivalent of vegetables include 2 medium carrots, 2 cups raw, leafy greens, 1 cup chopped vegetable (raw or cooked), or 1 medium baked potato. Grains Aim to eat 6 ounce-equivalents of whole grains each day. Examples of 1 ounce-equivalent of grains include 1 slice of bread, 1 cup ready-to-eat cereal, 3 cups popcorn, or  cup cooked rice, pasta, or cereal. Meats and other proteins Aim to eat 5-6 ounce-equivalents of protein each day. Examples of 1 ounce-equivalent of protein include 1 egg, 1/2 cup nuts or seeds, or 1 tablespoon (16 g) peanut butter. A cut of meat or fish that is the size of a deck of cards is about 3-4 ounce-equivalents.  Of the protein you eat each week, try to have at least 8 ounces come from seafood. This includes salmon, trout, herring, and anchovies. Dairy Aim to eat 3 cup-equivalents of fat-free or low-fat dairy each day. Examples of 1 cup-equivalent of dairy include 1 cup (240 mL) milk, 8 ounces (250 g) yogurt, 1 ounces (44 g) natural cheese, or 1 cup (240 mL) fortified soy milk. Fats and oils  Aim for about 5 teaspoons (21 g) per day. Choose monounsaturated fats, such as canola and olive oils, avocados, peanut butter, and most nuts, or polyunsaturated fats, such as sunflower, corn, and soybean oils, walnuts, pine nuts, sesame seeds, sunflower seeds, and flaxseed. Beverages  Aim for six 8-oz glasses of water per day. Limit  coffee to three to five 8-oz cups per day.  Limit caffeinated beverages that have added calories, such as soda and energy drinks.  Limit alcohol intake to no more than 1 drink a day for nonpregnant women and 2 drinks a day for men. One drink equals 12 oz of beer (355 mL), 5 oz of wine (148 mL), or 1 oz of hard liquor (44 mL). Seasoning and other foods  Avoid adding excess amounts of salt to your foods. Try flavoring foods with herbs and spices instead of salt.  Avoid adding sugar to foods.  Try using oil-based dressings, sauces, and spreads instead of solid fats. This information is based on general [place] nutrition guidelines. For more information, visit [employer]. Exact amounts may vary based on your nutrition needs. Summary  A healthy eating plan may help you to maintain a healthy weight, reduce the risk of chronic diseases, and stay active throughout your life.  Plan your meals. Make sure you eat the right portions of a variety of nutrient-rich foods.  Try baking, boiling, grilling, or broiling instead of frying.  Choose healthy options in all settings, including home, work, school, restaurants, or stores. This information is not intended to replace advice given to you by your health care provider.  Make sure you discuss any questions you have with your health care provider. Document Revised: 07/19/2017 Document Reviewed: 07/19/2017 Elsevier Patient Education  [employer].

## 2020-07-22 NOTE — Progress Notes (Signed)
Patient has eaten today and has taken medication today. Patient denies pain at this time. Patient reports slight light-headness today. Patient stood up too fast and had syncope episode.

## 2020-07-22 NOTE — Progress Notes (Signed)
Established Patient Office Visit  Subjective:  Patient ID: Timothy [name], male    DOB: 10-30-1950  Age: 70 y.o. MRN: [id number]  CC:  Chief Complaint  Patient presents with  . Hospitalization Follow-up    Syncope    HPI Timothy [name] reports that he was seen in the emergency department Yesterday after having a syncopal event.  States that he had been feeling lightheaded after being outside standing up for a long time.  States that he sat down in his car, states that when he stood up quickly he had a syncopal event.  Reports that he was out for approximately 15 minutes.  States it was a witnessed event.  Denied any chest pain or shortness of breath, any seizure-like activity, denied any injury or trauma from the fall.  Hospital note     9:40 PM Patient had a witnessed syncopal episode that was transient and happened earlier today.  He did report feeling a bit lightheadedness prior to that.  Symptom is suggestive of orthostatic syncope.  Does have history of chronic kidney disease, currently his creatinine is 1.66, a bit elevated from his baseline.  EKG without concerning arrhythmia or ischemic changes, hemoglobin is 12.4, UA without signs of urinary tract infection.  Blood pressure is 115/79 and we did perform orthostatic vitals with increased heart rate.  We will give IV fluid and monitor closely.  At this time patient is resting comfortably and in no acute discomfort.  Care discussed with Dr. Tamera Punt  10:57 PM Patient received IV fluid, felt better, ambulated without difficulty, at this time he is stable for discharge.   Timothy Moras, PA-C 07/21/20 2259    Patient states today that he has not been eating much, states that he does not like the taste of food, "does not like to eat".  Reports that this has been present for the past couple of months.  Reports that he drinks approximately 2 bottles of water a day.  Previous weight of 174 in February 2019, today's weight 136.  Does not  track caloric intake.  Reports sleep is good.  Past Medical History:  Diagnosis Date  . Chronic kidney disease (CKD), stage II (mild)   . Essential hypertension   . Low back pain with radiation   . Personal history of prostate cancer     Past Surgical History:  Procedure Laterality Date  . PROSTATE SURGERY      Family History  Problem Relation Age of Onset  . Hypertension Mother   . Cancer Neg Hx   . Diabetes Neg Hx   . CVA Neg Hx     Social History   Socioeconomic History  . Marital status: Single    Spouse name: Not on file  . Number of children: Not on file  . Years of education: Not on file  . Highest education level: Not on file  Occupational History  . Not on file  Tobacco Use  . Smoking status: Current Some Day Smoker    Types: Cigars  . Smokeless tobacco: Never Used  Vaping Use  . Vaping Use: Never used  Substance and Sexual Activity  . Alcohol use: Yes  . Drug use: Yes    Types: Marijuana  . Sexual activity: Not Currently  Other Topics Concern  . Not on file  Social History Narrative  . Not on file   Social Determinants of Health   Financial Resource Strain: Not on file  Food Insecurity: Not on file  Transportation Needs: Not on file  Physical Activity: Not on file  Stress: Not on file  Social Connections: Not on file  Intimate Partner Violence: Not on file    Outpatient Medications Prior to Visit  Medication Sig Dispense Refill  . amLODipine-benazepril (LOTREL) 10-40 MG capsule Take 1 capsule by mouth daily. 30 capsule 6  . baclofen (LIORESAL) 10 MG tablet Take 10 mg by mouth 3 (three) times daily.    Marland Kitchen gabapentin (NEURONTIN) 300 MG capsule Take 1 capsule (300 mg total) by mouth 3 (three) times daily as needed. 270 capsule 1  . HYDROcodone-acetaminophen (NORCO/VICODIN) 5-325 MG tablet     . meloxicam (MOBIC) 7.5 MG tablet Take 2 tablets (15 mg total) by mouth daily. 60 tablet 3  . NARCAN 4 MG/0.1ML LIQD nasal spray kit      No  facility-administered medications prior to visit.    No Known Allergies  ROS Review of Systems  Constitutional: Negative for chills, fatigue and fever.  HENT: Negative.   Eyes: Negative.   Respiratory: Negative for shortness of breath and wheezing.   Cardiovascular: Negative for chest pain.  Gastrointestinal: Negative.   Endocrine: Negative.   Genitourinary: Negative.   Musculoskeletal: Negative.   Skin: Negative.   Allergic/Immunologic: Negative.   Neurological: Positive for syncope. Negative for weakness and headaches.  Hematological: Negative.   Psychiatric/Behavioral: Negative for dysphoric mood, self-injury, sleep disturbance and suicidal ideas. The patient is not nervous/anxious.       Objective:    Physical Exam Vitals and nursing note reviewed.  Constitutional:      General: He is not in acute distress.    Appearance: Normal appearance. He is not ill-appearing.  HENT:     Head: Normocephalic and atraumatic.     Right Ear: External ear normal.     Left Ear: External ear normal.     Nose: Nose normal.     Mouth/Throat:     Mouth: Mucous membranes are moist.     Pharynx: Oropharynx is clear.  Eyes:     Conjunctiva/sclera: Conjunctivae normal.     Pupils: Pupils are equal, round, and reactive to light.  Cardiovascular:     Rate and Rhythm: Normal rate and regular rhythm.     Pulses: Normal pulses.     Heart sounds: Normal heart sounds.  Pulmonary:     Effort: Pulmonary effort is normal.     Breath sounds: Normal breath sounds.  Musculoskeletal:        General: Normal range of motion.     Cervical back: Normal range of motion and neck supple.  Skin:    General: Skin is warm and dry.  Neurological:     General: No focal deficit present.     Mental Status: He is alert and oriented to person, place, and time.  Psychiatric:        Mood and Affect: Mood normal.        Behavior: Behavior normal.        Thought Content: Thought content normal.        Judgment:  Judgment normal.     BP (!) 143/87 (BP Location: Left Arm, Patient Position: Sitting, Cuff Size: Normal)   Pulse 84   Temp 98.2 F (36.8 C) (Oral)   Resp 18   Ht _0  (1.676 m)   Wt 136 lb (61.7 kg)   SpO2 99%   BMI 21.95 kg/m  Wt Readings from Last 3 Encounters:  07/22/20 136 lb (61.7 kg)  07/21/20 136 lb (61.7 kg)  02/14/20 136 lb 12.8 oz (62.1 kg)     Health Maintenance Due  Topic Date Due  . Hepatitis C Screening  Never done  . COVID-19 Vaccine (1) Never done  . COLONOSCOPY (Pts 45-22yr Insurance coverage will need to be confirmed)  11/05/2013  . PNA vac Low Risk Adult (2 of 2 - PCV13) 11/23/2018    There are no preventive care reminders to display for this patient.  Lab Results  Component Value Date   TSH 0.808 10/19/2007   Lab Results  Component Value Date   WBC 6.8 07/21/2020   HGB 12.4 (L) 07/21/2020   HCT 36.6 (L) 07/21/2020   MCV 94.6 07/21/2020   PLT 186 07/21/2020   Lab Results  Component Value Date   NA 136 07/21/2020   K 3.6 07/21/2020   CO2 27 07/21/2020   GLUCOSE 120 (H) 07/21/2020   BUN 18 07/21/2020   CREATININE 1.66 (H) 07/21/2020   BILITOT 0.5 02/10/2019   ALKPHOS 49 02/10/2019   AST 20 02/10/2019   ALT 13 02/10/2019   PROT 7.4 02/10/2019   ALBUMIN 4.5 02/10/2019   CALCIUM 9.1 07/21/2020   ANIONGAP 9 07/21/2020   Lab Results  Component Value Date   CHOL 125 03/14/2018   Lab Results  Component Value Date   HDL 43 03/14/2018   Lab Results  Component Value Date   LDLCALC 67 03/14/2018   Lab Results  Component Value Date   TRIG 77 03/14/2018   Lab Results  Component Value Date   CHOLHDL 2.9 03/14/2018   No results found for: HGBA1C    Assessment & Plan:   Problem List Items Addressed This Visit   None   1. Anorexia UA did show ketones and protein.  Patient encouraged to increase caloric intake.  Trial cyproheptadine, encourage patient to take 1 tab once a day for 1 week prior to increasing dosing.  Patient to  return to mobile unit in 2 weeks for for follow-up.  [facility] was last seen by Dr. NMargarita Ranain January 2022. - cyproheptadine (PERIACTIN) 4 MG tablet; Take 1 tab PO TID PRN for appetite  Dispense: 30 tablet; Refill: 0 - Comp. Metabolic Panel (12)  2. Syncope, unspecified syncope type   3. Loss of weight  - Comp. Metabolic Panel (12) - TSH  4. Screening for metabolic disorder  - Lipid panel   No orders of the defined types were placed in this encounter.    I have reviewed the patient's medical history (PMH, PSH, Social History, Family History, Medications, and allergies) , and have been updated if relevant. I spent 30 minutes reviewing chart and  face to face time with patient.    Follow-up: No follow-ups on file.    CLoraine GripMayers, PA-C

## 2020-07-23 LAB — LIPID PANEL
Chol/HDL Ratio: 3.1 ratio (ref 0.0–5.0)
Cholesterol, Total: 126 mg/dL (ref 100–199)
HDL: 41 mg/dL (ref >39)
LDL Chol Calc (NIH): 67 mg/dL (ref 0–99)
Triglycerides: 97 mg/dL (ref 0–149)
VLDL Cholesterol Cal: 18 mg/dL (ref 5–40)

## 2020-07-23 LAB — COMP. METABOLIC PANEL (12)
AST: 22 IU/L (ref 0–40)
Albumin/Globulin Ratio: 1.6 (ref 1.2–2.2)
Albumin: 4.7 g/dL (ref 3.8–4.8)
Alkaline Phosphatase: 55 IU/L (ref 44–121)
BUN/Creatinine Ratio: 9 — ABNORMAL LOW (ref 10–24)
BUN: 10 mg/dL (ref 8–27)
Bilirubin Total: 0.9 mg/dL (ref 0.0–1.2)
Calcium: 9.5 mg/dL (ref 8.6–10.2)
Chloride: 101 mmol/L (ref 96–106)
Creatinine, Ser: 1.17 mg/dL (ref 0.76–1.27)
Globulin, Total: 2.9 g/dL (ref 1.5–4.5)
Glucose: 96 mg/dL (ref 65–99)
Potassium: 3.8 mmol/L (ref 3.5–5.2)
Sodium: 136 mmol/L (ref 134–144)
Total Protein: 7.6 g/dL (ref 6.0–8.5)
eGFR: 68 mL/min/{1.73_m2} (ref >59)

## 2020-07-23 LAB — TSH: TSH: 0.984 u[IU]/mL (ref 0.450–4.500)

## 2020-07-31 ENCOUNTER — Telehealth: Payer: Self-pay | Admitting: *Deleted

## 2020-07-31 NOTE — Telephone Encounter (Signed)
Patient verified DOB Patient is aware of labs being normal except for showing dehydration. Patient encouraged to increase water intake.

## 2020-07-31 NOTE — Telephone Encounter (Signed)
-----   Message from Timothy [name], [place] sent at 07/23/2020  9:29 AM EDT ----- Please call patient and let him know that his thyroid function, kidney and liver function are within normal limits, he does show signs of dehydration.  His cholesterol is well controlled.

## 2020-12-30 ENCOUNTER — Other Ambulatory Visit: Payer: Self-pay

## 2020-12-30 ENCOUNTER — Emergency Department (HOSPITAL_COMMUNITY)
Admission: EM | Admit: 2020-12-30 | Discharge: 2020-12-31 | Disposition: A | Discharge status: Home / Self Care | Payer: Medicare HMO | Attending: Emergency Medicine | Admitting: Emergency Medicine

## 2020-12-30 ENCOUNTER — Ambulatory Visit: Payer: Self-pay | Admitting: *Deleted

## 2020-12-30 ENCOUNTER — Encounter (HOSPITAL_COMMUNITY): Payer: Self-pay | Admitting: Emergency Medicine

## 2020-12-30 DIAGNOSIS — E860 Dehydration: Secondary | ICD-10-CM | POA: Diagnosis not present

## 2020-12-30 DIAGNOSIS — R55 Syncope and collapse: Secondary | ICD-10-CM | POA: Diagnosis not present

## 2020-12-30 DIAGNOSIS — N189 Chronic kidney disease, unspecified: Secondary | ICD-10-CM | POA: Diagnosis not present

## 2020-12-30 DIAGNOSIS — F17290 Nicotine dependence, other tobacco product, uncomplicated: Secondary | ICD-10-CM | POA: Insufficient documentation

## 2020-12-30 DIAGNOSIS — I129 Hypertensive chronic kidney disease with stage 1 through stage 4 chronic kidney disease, or unspecified chronic kidney disease: Secondary | ICD-10-CM | POA: Diagnosis not present

## 2020-12-30 LAB — COMPREHENSIVE METABOLIC PANEL
ALT: 16 U/L (ref 0–44)
AST: 20 U/L (ref 15–41)
Albumin: 4 g/dL (ref 3.5–5.0)
Alkaline Phosphatase: 44 U/L (ref 38–126)
Anion gap: 8 (ref 5–15)
BUN: 18 mg/dL (ref 8–23)
CO2: 26 mmol/L (ref 22–32)
Calcium: 9.4 mg/dL (ref 8.9–10.3)
Chloride: 104 mmol/L (ref 98–111)
Creatinine, Ser: 1.39 mg/dL — ABNORMAL HIGH (ref 0.61–1.24)
GFR, Estimated: 55 mL/min — ABNORMAL LOW (ref >60)
Glucose, Bld: 93 mg/dL (ref 70–99)
Potassium: 4.6 mmol/L (ref 3.5–5.1)
Sodium: 138 mmol/L (ref 135–145)
Total Bilirubin: 0.8 mg/dL (ref 0.3–1.2)
Total Protein: 7.2 g/dL (ref 6.5–8.1)

## 2020-12-30 LAB — CBC WITH DIFFERENTIAL/PLATELET
Abs Immature Granulocytes: 0.05 10*3/uL (ref 0.00–0.07)
Basophils Absolute: 0 10*3/uL (ref 0.0–0.1)
Basophils Relative: 0 %
Eosinophils Absolute: 0.3 10*3/uL (ref 0.0–0.5)
Eosinophils Relative: 4 %
HCT: 39.6 % (ref 39.0–52.0)
Hemoglobin: 13.3 g/dL (ref 13.0–17.0)
Immature Granulocytes: 1 %
Lymphocytes Relative: 19 %
Lymphs Abs: 1.5 10*3/uL (ref 0.7–4.0)
MCH: 32.4 pg (ref 26.0–34.0)
MCHC: 33.6 g/dL (ref 30.0–36.0)
MCV: 96.4 fL (ref 80.0–100.0)
Monocytes Absolute: 0.7 10*3/uL (ref 0.1–1.0)
Monocytes Relative: 9 %
Neutro Abs: 5.5 10*3/uL (ref 1.7–7.7)
Neutrophils Relative %: 67 %
Platelets: 170 10*3/uL (ref 150–400)
RBC: 4.11 MIL/uL — ABNORMAL LOW (ref 4.22–5.81)
RDW: 14.6 % (ref 11.5–15.5)
WBC: 8.1 10*3/uL (ref 4.0–10.5)
nRBC: 0 % (ref 0.0–0.2)

## 2020-12-30 NOTE — ED Triage Notes (Signed)
Pt arrived POV complaining of a syncopal event today. Pt states he started to feel overheated while outside and got lightheaded.  Pt states he laid down and passed out.   Pt states this has happened before but he has not been seen by a provider.

## 2020-12-30 NOTE — Telephone Encounter (Signed)
Reason for Disposition . Age > 50 years (Exception: occurred > 1 hour ago AND now feels completely fine)  Answer Assessment - Initial Assessment Questions 1. ONSET: "How long were you unconscious?" (minutes) "When did it happen?"     10 minutes- today in garage- 30 minutes ago 2. CONTENT: "What happened during period of unconsciousness?" (e.g., seizure activity)      No seizure 3. MENTAL STATUS: "Alert and oriented now?" (oriented x 3 = name, month, location)      Niece states alert now 4. TRIGGER: "What do you think caused the fainting?" "What were you doing just before you fainted?"  (e.g., exercise, sudden standing up, prolonged standing)     Sitting in chair- dizziness- hot,sweating 5. RECURRENT SYMPTOM: "Have you ever passed out before?" If Yes, ask: "When was the last time?" and "What happened that time?"      4-5 times over the last year 6. INJURY: "Did you sustain any injury during the fall?"      No pain 7. CARDIAC SYMPTOMS: "Have you had any of the following symptoms: chest pain, difficulty breathing, palpitations?"     Not known 8. NEUROLOGIC SYMPTOMS: "Have you had any of the following symptoms: headache, numbness, vertigo, weakness?"     no 9. GI SYMPTOMS: "Have you had any of the following symptoms: abdominal pain, vomiting, diarrhea, blood in stools?"     unknown 10. OTHER SYMPTOMS: "Do you have any other symptoms?"       no 11. PREGNANCY: "Is there any chance you are pregnant?" "When was your last menstrual period?"       N/a  Protocols used: Fainting-A-AH

## 2020-12-30 NOTE — Telephone Encounter (Signed)
Patient family members are calling- concerned that patient fainted today- patient was connected to call. Patient states he was dizzy, sweating/hot and fainted. Patient advised ED for evaluation- this has happened several times in the past- 2 months ago- last occurrence. Advised ED for evaluation

## 2020-12-30 NOTE — ED Provider Notes (Signed)
Emergency Medicine Provider Triage Evaluation Note  Timothy [name] , a 70 y.o. male  was evaluated in triage.  States that he was outside sitting on a stool when he started feeling lightheaded and had a syncopal episode.  Patient was with his son at the time he says that he was passed out for about 1 minute when he woke up.  He is completely asymptomatic since then.  He has had episodes like this before in the past under similar circumstances.  Said the last time he had this happen was when he was at a funeral.  He denies having any chest pain, shortness of breath, palpitations, abdominal pain, nausea, vomiting. Review of Systems  Positive: Syncope Negative: Chest pain, shortness of breath, palpitations, abdominal pain, nausea, vomit  Physical Exam  BP 113/76 (BP Location: Left Arm)   Pulse 64   Temp 97.7 F (36.5 C) (Oral)   Resp 14   Ht '5\' 6"'$  (1.676 m)   Wt 60.8 kg   SpO2 100%   BMI 21.63 kg/m  Gen:   Awake, no distress  Resp:  Normal effort.  Lungs clear to auscultation. MSK:   Moves extremities without difficulty  Other:  Heart sounds normal with no murmurs.  Regular rate and rhythm.  No lower extremity swelling.  Medical Decision Making  Medically screening exam initiated at 4:56 PM.  Appropriate orders placed.  Alban H Westby was informed that the remainder of the evaluation will be completed by another provider, this initial triage assessment does not replace that evaluation, and the importance of remaining in the ED until their evaluation is complete.   Sheila Oats 12/30/20 1658    Dorie Rank, MD 01/01/21 [phone]

## 2020-12-31 NOTE — Telephone Encounter (Signed)
Attempted to contact patient and VM was left informing patient to return call or go to ED/UC to be evaluated.

## 2020-12-31 NOTE — ED Provider Notes (Signed)
[facility] EMERGENCY DEPARTMENT Provider Note   CSN: [id number] Arrival date & time: 12/30/20  1432     History Chief Complaint  Patient presents with   Loss of Consciousness    Timothy [name] is a 70 y.o. male.  70 year old male with history of stage 2 CKD, HTN, prostate cancer, presents with syncopal episode. Patient states yesterday he was sitting in the car shop when he began to feel over heated, laid down and passed out. Patient came to with his son shaking him and states he has been feeling fine ever since. Reports this has happened in the past 2-3 times. Denies feeling chest pain or discomfort, shortness of breath, recent illness. No other complaints or concerns.       Past Medical History:  Diagnosis Date   Chronic kidney disease (CKD), stage II (mild)    Essential hypertension    Low back pain with radiation    Personal history of prostate cancer     Patient Active Problem List   Diagnosis Date Noted   Anorexia 07/22/2020   Syncope 07/22/2020   Loss of weight 07/22/2020   Essential hypertension 07/14/2018   Benign hypertension with chronic kidney disease, stage II 07/14/2018   Hematuria, gross 01/31/2018   Scalp lesion 08/20/2017   History of prostate cancer 07/07/2017   Chronic bilateral low back pain with right-sided sciatica 07/07/2017   History of hypertension 07/07/2017    Past Surgical History:  Procedure Laterality Date   PROSTATE SURGERY         Family History  Problem Relation Age of Onset   Hypertension Mother    Cancer Neg Hx    Diabetes Neg Hx    CVA Neg Hx     Social History   Tobacco Use   Smoking status: Some Days    Types: Cigars   Smokeless tobacco: Never  Vaping Use   Vaping Use: Never used  Substance Use Topics   Alcohol use: Yes   Drug use: Yes    Types: Marijuana    Home Medications Prior to Admission medications   Medication Sig Start Date End Date Taking? Authorizing Provider   amLODipine-benazepril (LOTREL) 10-40 MG capsule Take 1 capsule by mouth daily. 05/01/20   Charlott Rakes, MD  baclofen (LIORESAL) 10 MG tablet Take 10 mg by mouth 3 (three) times daily.    [provider]  cyproheptadine (PERIACTIN) 4 MG tablet Take 1 tab PO TID PRN for appetite 07/22/20   Mayers, Cari S, PA-C  gabapentin (NEURONTIN) 300 MG capsule Take 1 capsule (300 mg total) by mouth 3 (three) times daily as needed. 05/01/20   Charlott Rakes, MD  HYDROcodone-acetaminophen (NORCO/VICODIN) 5-325 MG tablet  06/16/18   [provider]  meloxicam (MOBIC) 7.5 MG tablet Take 2 tablets (15 mg total) by mouth daily. 12/29/18   Argentina Donovan, PA-C  NARCAN 4 MG/0.1ML LIQD nasal spray kit  06/16/18   [provider]    Allergies    Patient has no known allergies.  Review of Systems   Review of Systems  Constitutional:  Negative for fever.  Eyes:  Negative for visual disturbance.  Respiratory:  Negative for shortness of breath.   Cardiovascular:  Negative for chest pain.  Gastrointestinal:  Negative for abdominal pain, nausea and vomiting.  Genitourinary:  Negative for difficulty urinating.  Musculoskeletal:  Negative for arthralgias, back pain, gait problem and neck pain.  Skin:  Negative for rash and wound.  Allergic/Immunologic:  Negative for immunocompromised state.  Neurological:  Positive for syncope. Negative for dizziness, speech difficulty and light-headedness.  Psychiatric/Behavioral:  Negative for confusion.   All other systems reviewed and are negative.  Physical Exam Updated Vital Signs BP 129/78   Pulse 65   Temp (!) 97.4 F (36.3 C) (Oral)   Resp 14   Ht $R'5\' 6"'OS$  (1.676 m)   Wt 60.8 kg   SpO2 100%   BMI 21.63 kg/m   Physical Exam Vitals and nursing note reviewed.  Constitutional:      General: He is not in acute distress.    Appearance: He is well-developed. He is not diaphoretic.  HENT:     Head: Normocephalic and atraumatic.      Mouth/Throat:     Mouth: Mucous membranes are moist.  Eyes:     Extraocular Movements: Extraocular movements intact.     Pupils: Pupils are equal, round, and reactive to light.  Cardiovascular:     Rate and Rhythm: Normal rate and regular rhythm.     Pulses: Normal pulses.     Heart sounds: Normal heart sounds.  Pulmonary:     Effort: Pulmonary effort is normal.     Breath sounds: Normal breath sounds.  Abdominal:     Palpations: Abdomen is soft.     Tenderness: There is no abdominal tenderness.  Musculoskeletal:     Cervical back: Neck supple.     Right lower leg: No edema.     Left lower leg: No edema.  Skin:    General: Skin is warm and dry.     Findings: No erythema or rash.  Neurological:     Mental Status: He is alert and oriented to person, place, and time.     Cranial Nerves: No cranial nerve deficit.     Sensory: No sensory deficit.     Motor: No weakness.     Gait: Gait normal.  Psychiatric:        Behavior: Behavior normal.    ED Results / Procedures / Treatments   Labs (all labs ordered are listed, but only abnormal results are displayed) Labs Reviewed  CBC WITH DIFFERENTIAL/PLATELET - Abnormal; Notable for the following components:      Result Value   RBC 4.11 (*)    All other components within normal limits  COMPREHENSIVE METABOLIC PANEL - Abnormal; Notable for the following components:   Creatinine, Ser 1.39 (*)    GFR, Estimated 55 (*)    All other components within normal limits  CBG MONITORING, ED    EKG EKG Interpretation  Date/Time:  Monday December 30 2020 16:57:29 EDT Ventricular Rate:  63 PR Interval:  148 QRS Duration: 100 QT Interval:  444 QTC Calculation: 425 R Axis:   -55 Text Interpretation: Normal sinus rhythm Incomplete right bundle branch block Left anterior fascicular block Minimal voltage criteria for LVH, may be normal variant ( Cornell product ) Septal infarct , age undetermined Abnormal ECG Confirmed by Ripley Fraise  [phone]) on 12/31/2020 5:49:55 AM  Radiology No results found.  Procedures Procedures   Medications Ordered in ED Medications - No data to display  ED Course  I have reviewed the triage vital signs and the nursing notes.  Pertinent labs & imaging results that were available during my care of the patient were reviewed by me and considered in my medical decision making (see chart for details).  Clinical Course as of 12/31/20 1137  Tue Dec 31, 2020  1044 Monocyte #: [id number] [MH]  [id number] 70 year old male brought in for syncope, feeling hot prior to event, has been asymptomatic since. Lengthy wait in the ER prior to being seen today. States he is feeling fine and would like to be dc to see his PCP as scheduled today. Reviewed EKG with Dr. Sabra Heck, ER attending, found to have isolated V3 change compared to prior EKG and requested repeat. CBC and CMP reviewed, mild increase in Cr today to 1.39 (known CKD 2, prior Cr 1.17). Patient states his urine is a little darker today than usual. Offered IV fluids vs PO, patient would prefer PO and declines IV hydration. Repeat EKG is unchanged from initial for this visit.  Patient remains asymptomatic and feeling well.  Plan is for discharge to follow-up with PCP with return to ER precautions. [LM]    Clinical Course User Index [LM] Tacy Learn, PA-C [MH] Sigmund Hazel   MDM Rules/Calculators/A&P                           Final Clinical Impression(s) / ED Diagnoses Final diagnoses:  Syncope, unspecified syncope type  Dehydration    Rx / DC Orders ED Discharge Orders     None        Roque Lias 12/31/20 1138    Noemi Chapel, MD 01/03/21 [phone]

## 2020-12-31 NOTE — Discharge Instructions (Addendum)
Follow up with your doctor. Return to the Er for chest pain, weakness, numbness, any worsening or concerning symptoms.  Home to rest and hydrate.

## 2021-01-01 ENCOUNTER — Encounter: Payer: Self-pay | Admitting: Family Medicine

## 2021-01-01 ENCOUNTER — Telehealth: Payer: Self-pay | Admitting: Family Medicine

## 2021-01-01 ENCOUNTER — Other Ambulatory Visit: Payer: Self-pay

## 2021-01-01 ENCOUNTER — Ambulatory Visit: Payer: Medicare HMO | Attending: Family Medicine | Admitting: Family Medicine

## 2021-01-01 VITALS — BP 121/66 | HR 94 | Ht 66.0 in | Wt 136.4 lb

## 2021-01-01 DIAGNOSIS — Z23 Encounter for immunization: Secondary | ICD-10-CM

## 2021-01-01 DIAGNOSIS — I10 Essential (primary) hypertension: Secondary | ICD-10-CM | POA: Diagnosis not present

## 2021-01-01 DIAGNOSIS — T671XXA Heat syncope, initial encounter: Secondary | ICD-10-CM | POA: Diagnosis not present

## 2021-01-01 MED ORDER — LISINOPRIL-HYDROCHLOROTHIAZIDE 20-12.5 MG PO TABS: 2.0000 | ORAL_TABLET | Freq: Every day | ORAL | 1 refills | Status: DC

## 2021-01-01 NOTE — Patient Instructions (Signed)

## 2021-01-01 NOTE — Progress Notes (Signed)
Subjective:  Patient ID: Timothy [name], male    DOB: 12/25/1950  Age: 70 y.o. MRN: [id number]  CC: Loss of Consciousness   HPI Timothy [name] is a 70 y.o. year old male with a history of Hypertension, chronic low back pain, history of Prostate ca  Interval History: He notices that when he gets hot he has then has passed out; last episode was 2 days ago. He states he is conscious and aware of his surrounding  "I don't go out all way".  He just needs to drink lots of fluids and then he is back up on his feet.  This has occurred when he was at a cookout and standing under the sun. Lotrel causes his R side to hurt and he has noticed when he skips it symptoms are absent.  Would like to change his antihypertensive to something else. Past Medical History:  Diagnosis Date   Chronic kidney disease (CKD), stage II (mild)    Essential hypertension    Low back pain with radiation    Personal history of prostate cancer     Past Surgical History:  Procedure Laterality Date   PROSTATE SURGERY      Family History  Problem Relation Age of Onset   Hypertension Mother    Cancer Neg Hx    Diabetes Neg Hx    CVA Neg Hx     No Known Allergies  Outpatient Medications Prior to Visit  Medication Sig Dispense Refill   baclofen (LIORESAL) 10 MG tablet Take 10 mg by mouth 3 (three) times daily.     cyproheptadine (PERIACTIN) 4 MG tablet Take 1 tab PO TID PRN for appetite 30 tablet 0   gabapentin (NEURONTIN) 300 MG capsule Take 1 capsule (300 mg total) by mouth 3 (three) times daily as needed. 270 capsule 1   HYDROcodone-acetaminophen (NORCO/VICODIN) 5-325 MG tablet      meloxicam (MOBIC) 7.5 MG tablet Take 2 tablets (15 mg total) by mouth daily. 60 tablet 3   NARCAN 4 MG/0.1ML LIQD nasal spray kit      amLODipine-benazepril (LOTREL) 10-40 MG capsule Take 1 capsule by mouth daily. 30 capsule 6   No facility-administered medications prior to visit.     ROS Review of Systems   Constitutional:  Negative for activity change and appetite change.  HENT:  Negative for sinus pressure and sore throat.   Eyes:  Negative for visual disturbance.  Respiratory:  Negative for cough, chest tightness and shortness of breath.   Cardiovascular:  Negative for chest pain and leg swelling.  Gastrointestinal:  Negative for abdominal distention, abdominal pain, constipation and diarrhea.  Endocrine: Negative.   Genitourinary:  Negative for dysuria.  Musculoskeletal:  Negative for joint swelling and myalgias.  Skin:  Negative for rash.  Allergic/Immunologic: Negative.   Neurological:  Negative for weakness, light-headedness and numbness.  Psychiatric/Behavioral:  Negative for dysphoric mood and suicidal ideas.    Objective:  BP 121/66   Pulse 94   Ht _0  (1.676 m)   Wt 136 lb 6.4 oz (61.9 kg)   SpO2 100%   BMI 22.02 kg/m   BP/Weight 01/01/2021 12/31/2020 3/55/9741  Systolic BP 638 453 -  Diastolic BP 66 78 -  Wt. (Lbs) 136.4 - 134  BMI 22.02 - 21.63      Physical Exam Constitutional:      Appearance: He is well-developed.  Cardiovascular:     Rate and Rhythm: Normal rate.     Heart sounds: Normal  heart sounds. No murmur heard. Pulmonary:     Effort: Pulmonary effort is normal.     Breath sounds: Normal breath sounds. No wheezing or rales.  Chest:     Chest wall: No tenderness.  Abdominal:     General: Bowel sounds are normal. There is no distension.     Palpations: Abdomen is soft. There is no mass.     Tenderness: There is no abdominal tenderness.  Musculoskeletal:        General: Normal range of motion.     Right lower leg: No edema.     Left lower leg: No edema.  Neurological:     Mental Status: He is alert and oriented to person, place, and time.  Psychiatric:        Mood and Affect: Mood normal.    CMP Latest Ref Rng & Units 12/30/2020 07/22/2020 07/21/2020  Glucose 70 - 99 mg/dL 93 96 120(H)  BUN 8 - 23 mg/dL _0 Creatinine 0.61 - 1.24 mg/dL  1.39(H) 1.17 1.66(H)  Sodium 135 - 145 mmol/L 138 136 136  Potassium 3.5 - 5.1 mmol/L 4.6 3.8 3.6  Chloride 98 - 111 mmol/L 104 101 100  CO2 22 - 32 mmol/L 26 - 27  Calcium 8.9 - 10.3 mg/dL 9.4 9.5 9.1  Total Protein 6.5 - 8.1 g/dL 7.2 7.6 -  Total Bilirubin 0.3 - 1.2 mg/dL 0.8 0.9 -  Alkaline Phos 38 - 126 U/L 44 55 -  AST 15 - 41 U/L 20 22 -  ALT 0 - 44 U/L 16 - -    Lipid Panel     Component Value Date/Time   CHOL 126 07/22/2020 1532   TRIG 97 07/22/2020 1532   HDL 41 07/22/2020 1532   CHOLHDL 3.1 07/22/2020 1532   CHOLHDL 5.0 Ratio 05/06/2010 2038   VLDL 37 05/06/2010 2038   LDLCALC 67 07/22/2020 1532    CBC    Component Value Date/Time   WBC 8.1 12/30/2020 1656   RBC 4.11 (L) 12/30/2020 1656   HGB 13.3 12/30/2020 1656   HGB 13.8 02/10/2019 1122   HCT 39.6 12/30/2020 1656   HCT 41.7 02/10/2019 1122   PLT 170 12/30/2020 1656   PLT 198 02/10/2019 1122   MCV 96.4 12/30/2020 1656   MCV 94 02/10/2019 1122   MCH 32.4 12/30/2020 1656   MCHC 33.6 12/30/2020 1656   RDW 14.6 12/30/2020 1656   RDW 13.0 02/10/2019 1122   LYMPHSABS 1.5 12/30/2020 1656   LYMPHSABS 2.8 02/10/2019 1122   MONOABS 0.7 12/30/2020 1656   EOSABS 0.3 12/30/2020 1656   EOSABS 0.5 (H) 02/10/2019 1122   BASOSABS 0.0 12/30/2020 1656   BASOSABS 0.0 02/10/2019 1122    No results found for: HGBA1C  Assessment & Plan:  1. Essential hypertension Controlled Switched from Lotrel to lisinopril/HCTZ due to patient request Will reassess at next visit for BP control Counseled on blood pressure goal of less than 130/80, low-sodium, DASH diet, medication compliance, 150 minutes of moderate intensity exercise per week. Discussed medication compliance, adverse effects. - lisinopril-hydrochlorothiazide (ZESTORETIC) 20-12.5 MG tablet; Take 2 tablets by mouth daily.  Dispense: 90 tablet; Refill: 1  2. Need for immunization against influenza - Flu Vaccine QUAD 82moIM (Fluarix, Fluzone & Alfiuria Quad PF)  3.  Heat syncope, initial encounter Advised to stay hydrated, avoid direct sun exposure especially during the peak hours.   Health Care Maintenance: He will need a PSA level for surveillance.  We will order this  at his next visit along with a potassium level Meds ordered this encounter  Medications   lisinopril-hydrochlorothiazide (ZESTORETIC) 20-12.5 MG tablet    Sig: Take 2 tablets by mouth daily.    Dispense:  90 tablet    Refill:  1    Discontinue Lotrel    Follow-up: Return in about 1 month (around 01/31/2021) for Hypertension follow up.       Charlott Rakes, MD, FAAFP. [facility] and [facility], [place]   01/01/2021, 1:15 PM

## 2021-01-01 NOTE — Progress Notes (Signed)
Timothy [name] has been making his side hurt.

## 2021-01-01 NOTE — Telephone Encounter (Signed)
Copied from [place] [id number]. Topic: General - Other >> Jan 01, 2021 11:44 AM Celene Kras wrote: Reason for CRM: Pts niece, Nelly Rout, calling and is requesting to have a call to go over pts appt today. Please advise.

## 2021-02-20 ENCOUNTER — Encounter: Payer: Self-pay | Admitting: Family Medicine

## 2021-02-20 ENCOUNTER — Other Ambulatory Visit: Payer: Self-pay

## 2021-02-20 ENCOUNTER — Ambulatory Visit: Payer: Medicare HMO | Attending: Family Medicine | Admitting: Family Medicine

## 2021-02-20 VITALS — BP 145/88 | HR 70 | Ht 66.0 in | Wt 145.2 lb

## 2021-02-20 DIAGNOSIS — Z8546 Personal history of malignant neoplasm of prostate: Secondary | ICD-10-CM

## 2021-02-20 DIAGNOSIS — Z1159 Encounter for screening for other viral diseases: Secondary | ICD-10-CM

## 2021-02-20 DIAGNOSIS — I10 Essential (primary) hypertension: Secondary | ICD-10-CM | POA: Diagnosis not present

## 2021-02-20 DIAGNOSIS — Z23 Encounter for immunization: Secondary | ICD-10-CM

## 2021-02-20 MED ORDER — LISINOPRIL-HYDROCHLOROTHIAZIDE 20-12.5 MG PO TABS
2.0000 | ORAL_TABLET | Freq: Every day | ORAL | 1 refills | Status: DC
Start: 2021-02-20 — End: 2021-05-27

## 2021-02-20 MED ORDER — AMLODIPINE BESYLATE 2.5 MG PO TABS: 2.5000 mg | ORAL_TABLET | Freq: Every day | ORAL | 1 refills | Status: DC

## 2021-02-20 NOTE — Progress Notes (Signed)
Needs medications refilled.

## 2021-02-20 NOTE — Patient Instructions (Signed)

## 2021-02-20 NOTE — Progress Notes (Signed)
Subjective:  Patient ID: Timothy [name], male    DOB: 06-Nov-1950  Age: 70 y.o. MRN: [id number]  CC: Hypertension   HPI Timothy [name] is a 70 y.o. year old male with a history of Hypertension, chronic low back pain, history of Prostate ca    Interval History: At his last visit Lotrel was switched to lisinopril/HCTZ due to patient's request as he stated the former caused his right side to hurt. He is doing well today and his side is not hurting with Lisinopril/HCTZ. For his prostate cancer he is currently under the care of urology. He denies presence of any concerns today. Past Medical History:  Diagnosis Date   Chronic kidney disease (CKD), stage II (mild)    Essential hypertension    Low back pain with radiation    Personal history of prostate cancer     Past Surgical History:  Procedure Laterality Date   PROSTATE SURGERY      Family History  Problem Relation Age of Onset   Hypertension Mother    Cancer Neg Hx    Diabetes Neg Hx    CVA Neg Hx     No Known Allergies  Outpatient Medications Prior to Visit  Medication Sig Dispense Refill   baclofen (LIORESAL) 10 MG tablet Take 10 mg by mouth 3 (three) times daily.     cyproheptadine (PERIACTIN) 4 MG tablet Take 1 tab PO TID PRN for appetite 30 tablet 0   gabapentin (NEURONTIN) 300 MG capsule Take 1 capsule (300 mg total) by mouth 3 (three) times daily as needed. 270 capsule 1   HYDROcodone-acetaminophen (NORCO/VICODIN) 5-325 MG tablet      lisinopril-hydrochlorothiazide (ZESTORETIC) 20-12.5 MG tablet Take 2 tablets by mouth daily. 90 tablet 1   meloxicam (MOBIC) 7.5 MG tablet Take 2 tablets (15 mg total) by mouth daily. 60 tablet 3   NARCAN 4 MG/0.1ML LIQD nasal spray kit      No facility-administered medications prior to visit.     ROS Review of Systems  Constitutional:  Negative for activity change and appetite change.  HENT:  Negative for sinus pressure and sore throat.   Eyes:  Negative for visual  disturbance.  Respiratory:  Negative for cough, chest tightness and shortness of breath.   Cardiovascular:  Negative for chest pain and leg swelling.  Gastrointestinal:  Negative for abdominal distention, abdominal pain, constipation and diarrhea.  Endocrine: Negative.   Genitourinary:  Negative for dysuria.  Musculoskeletal:  Negative for joint swelling and myalgias.  Skin:  Negative for rash.  Allergic/Immunologic: Negative.   Neurological:  Negative for weakness, light-headedness and numbness.  Psychiatric/Behavioral:  Negative for dysphoric mood and suicidal ideas.    Objective:  BP (!) 145/88   Pulse 70   Ht '5\' 6"'  (1.676 m)   Wt 145 lb 3.2 oz (65.9 kg)   SpO2 99%   BMI 23.44 kg/m   BP/Weight 02/20/2021 01/01/2021 09/24/3014  Systolic BP 010 932 355  Diastolic BP 88 66 78  Wt. (Lbs) 145.2 136.4 -  BMI 23.44 22.02 -      Physical Exam Constitutional:      Appearance: He is well-developed.  Cardiovascular:     Rate and Rhythm: Normal rate.     Heart sounds: Normal heart sounds. No murmur heard. Pulmonary:     Effort: Pulmonary effort is normal.     Breath sounds: Normal breath sounds. No wheezing or rales.  Chest:     Chest wall: No tenderness.  Abdominal:  General: Bowel sounds are normal. There is no distension.     Palpations: Abdomen is soft. There is no mass.     Tenderness: There is no abdominal tenderness.  Musculoskeletal:        General: Normal range of motion.     Right lower leg: No edema.     Left lower leg: No edema.  Neurological:     Mental Status: He is alert and oriented to person, place, and time.  Psychiatric:        Mood and Affect: Mood normal.    CMP Latest Ref Rng & Units 12/30/2020 07/22/2020 07/21/2020  Glucose 70 - 99 mg/dL 93 96 120(H)  BUN 8 - 23 mg/dL '18 10 18  ' Creatinine 0.61 - 1.24 mg/dL 1.39(H) 1.17 1.66(H)  Sodium 135 - 145 mmol/L 138 136 136  Potassium 3.5 - 5.1 mmol/L 4.6 3.8 3.6  Chloride 98 - 111 mmol/L 104 101 100  CO2  22 - 32 mmol/L 26 - 27  Calcium 8.9 - 10.3 mg/dL 9.4 9.5 9.1  Total Protein 6.5 - 8.1 g/dL 7.2 7.6 -  Total Bilirubin 0.3 - 1.2 mg/dL 0.8 0.9 -  Alkaline Phos 38 - 126 U/L 44 55 -  AST 15 - 41 U/L 20 22 -  ALT 0 - 44 U/L 16 - -    Lipid Panel     Component Value Date/Time   CHOL 126 07/22/2020 1532   TRIG 97 07/22/2020 1532   HDL 41 07/22/2020 1532   CHOLHDL 3.1 07/22/2020 1532   CHOLHDL 5.0 Ratio 05/06/2010 2038   VLDL 37 05/06/2010 2038   LDLCALC 67 07/22/2020 1532    CBC    Component Value Date/Time   WBC 8.1 12/30/2020 1656   RBC 4.11 (L) 12/30/2020 1656   HGB 13.3 12/30/2020 1656   HGB 13.8 02/10/2019 1122   HCT 39.6 12/30/2020 1656   HCT 41.7 02/10/2019 1122   PLT 170 12/30/2020 1656   PLT 198 02/10/2019 1122   MCV 96.4 12/30/2020 1656   MCV 94 02/10/2019 1122   MCH 32.4 12/30/2020 1656   MCHC 33.6 12/30/2020 1656   RDW 14.6 12/30/2020 1656   RDW 13.0 02/10/2019 1122   LYMPHSABS 1.5 12/30/2020 1656   LYMPHSABS 2.8 02/10/2019 1122   MONOABS 0.7 12/30/2020 1656   EOSABS 0.3 12/30/2020 1656   EOSABS 0.5 (H) 02/10/2019 1122   BASOSABS 0.0 12/30/2020 1656   BASOSABS 0.0 02/10/2019 1122    No results found for: HGBA1C  Assessment & Plan:  1. Essential hypertension Slightly above goal Low-dose amlodipine added Counseled on blood pressure goal of less than 130/80, low-sodium, DASH diet, medication compliance, 150 minutes of moderate intensity exercise per week. Discussed medication compliance, adverse effects. - amLODipine (NORVASC) 2.5 MG tablet; Take 1 tablet (2.5 mg total) by mouth daily.  Dispense: 90 tablet; Refill: 1 - lisinopril-hydrochlorothiazide (ZESTORETIC) 20-12.5 MG tablet; Take 2 tablets by mouth daily.  Dispense: 180 tablet; Refill: 1  2. Need for hepatitis C screening test - HCV Ab w Reflex to Quant PCR  3. History of prostate cancer - PSA, total and free  4. Need for shingles vaccine - Varicella-zoster vaccine IM  5. Need for  pneumococcal vaccine - Pneumococcal conjugate vaccine 20-valent   No orders of the defined types were placed in this encounter.   Return in about 3 months (around 05/23/2021) for Medical conditions.       Charlott Rakes, MD, FAAFP. [place]  [place], [place]   02/20/2021, 10:37 AM

## 2021-02-21 LAB — PSA, TOTAL AND FREE
PSA, Free Pct: 15.1 %
PSA, Free: 0.56 ng/mL
Prostate Specific Ag, Serum: 3.7 ng/mL (ref 0.0–4.0)

## 2021-02-21 LAB — HCV AB W REFLEX TO QUANT PCR: HCV Ab: <0.1 s/co ratio (ref 0.0–0.9)

## 2021-02-21 LAB — HCV INTERPRETATION

## 2021-02-24 ENCOUNTER — Telehealth: Payer: Self-pay

## 2021-02-24 NOTE — Telephone Encounter (Signed)
-----   Message from Ladell Pier, MD sent at 02/21/2021  6:27 PM EDT ----- Let patient know that his PSA level which is a screening test for prostate cancer has increased slightly from last year but is still within normal range.  Screen for hepatitis C is negative.

## 2021-02-24 NOTE — Telephone Encounter (Signed)
Patient name and DOB has been verified Patient was informed of lab results. Patient had no questions.  

## 2021-03-25 IMAGING — CR DG LUMBAR SPINE COMPLETE 4+V
5 series · 5 of 5 positions shown · non-contrast
Comparison: 08/15/2010

CLINICAL DATA: Chronic low back pain for several years

EXAM:
LUMBAR SPINE - COMPLETE 4+ VIEW

[l-spine ap]
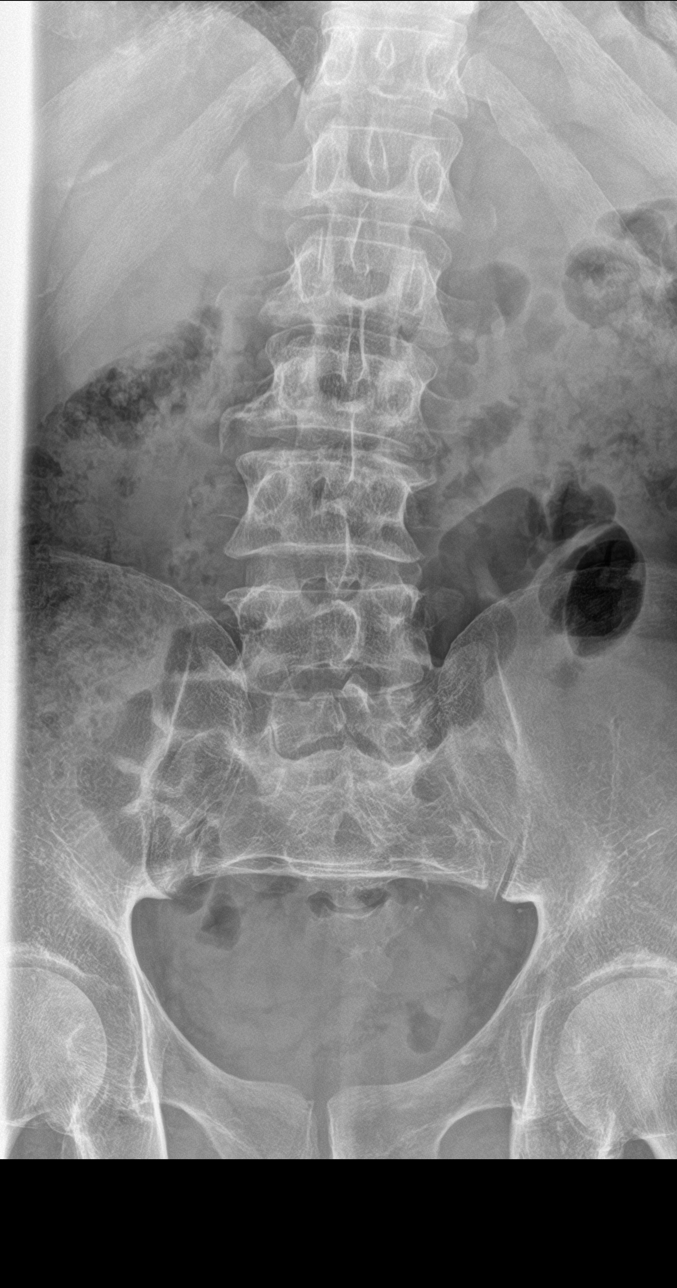

[l-spine obl (1 of 2)]
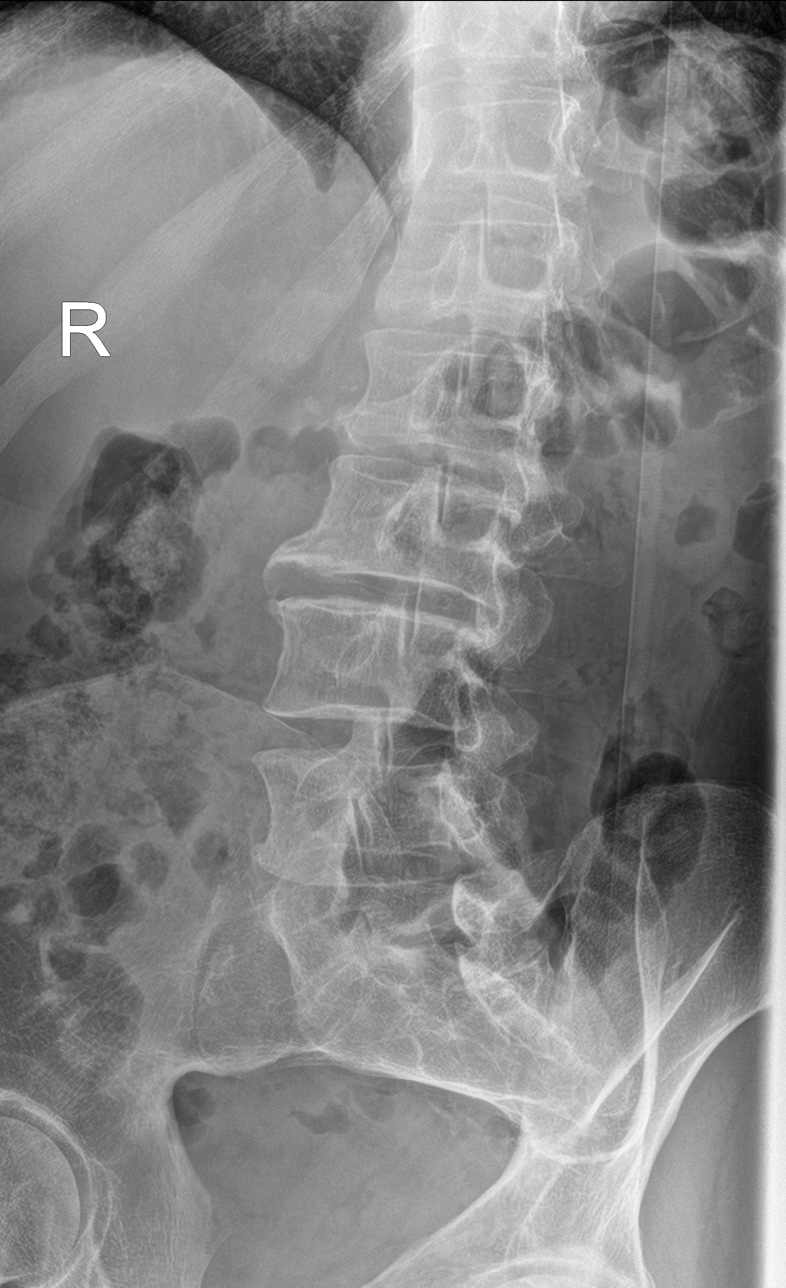

[l-spine obl (2 of 2)]
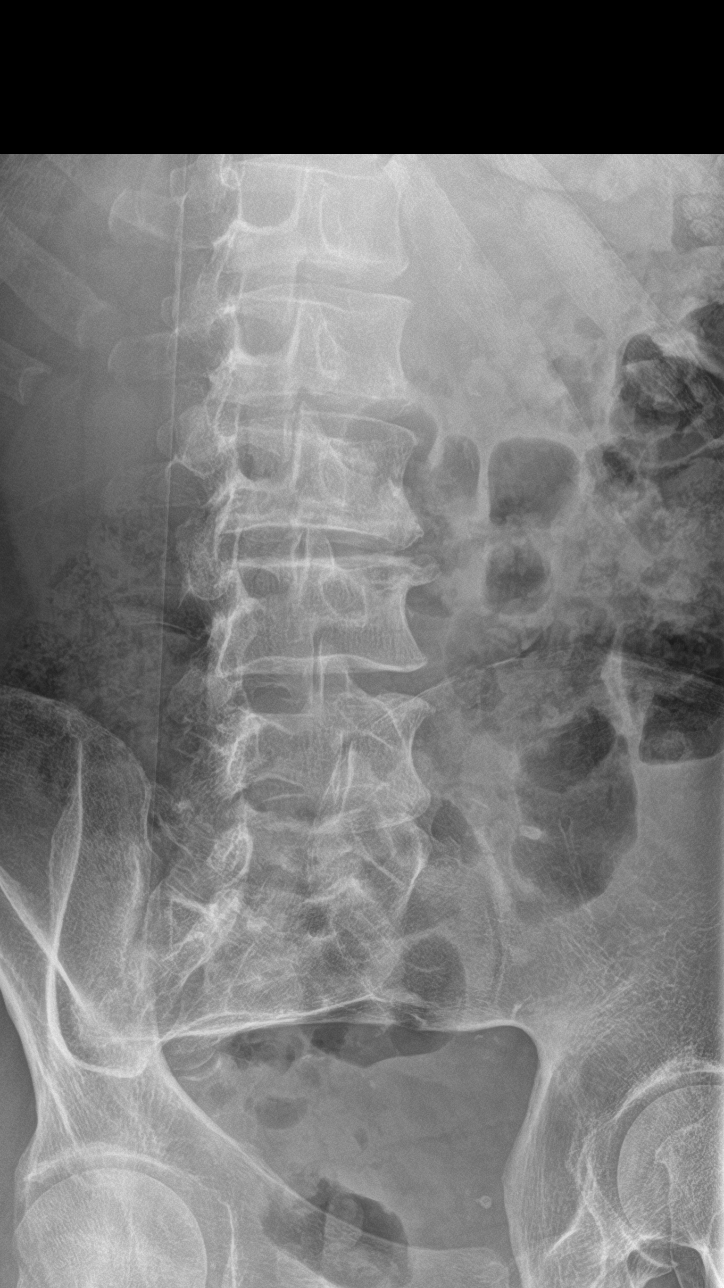

[l-spine lat]
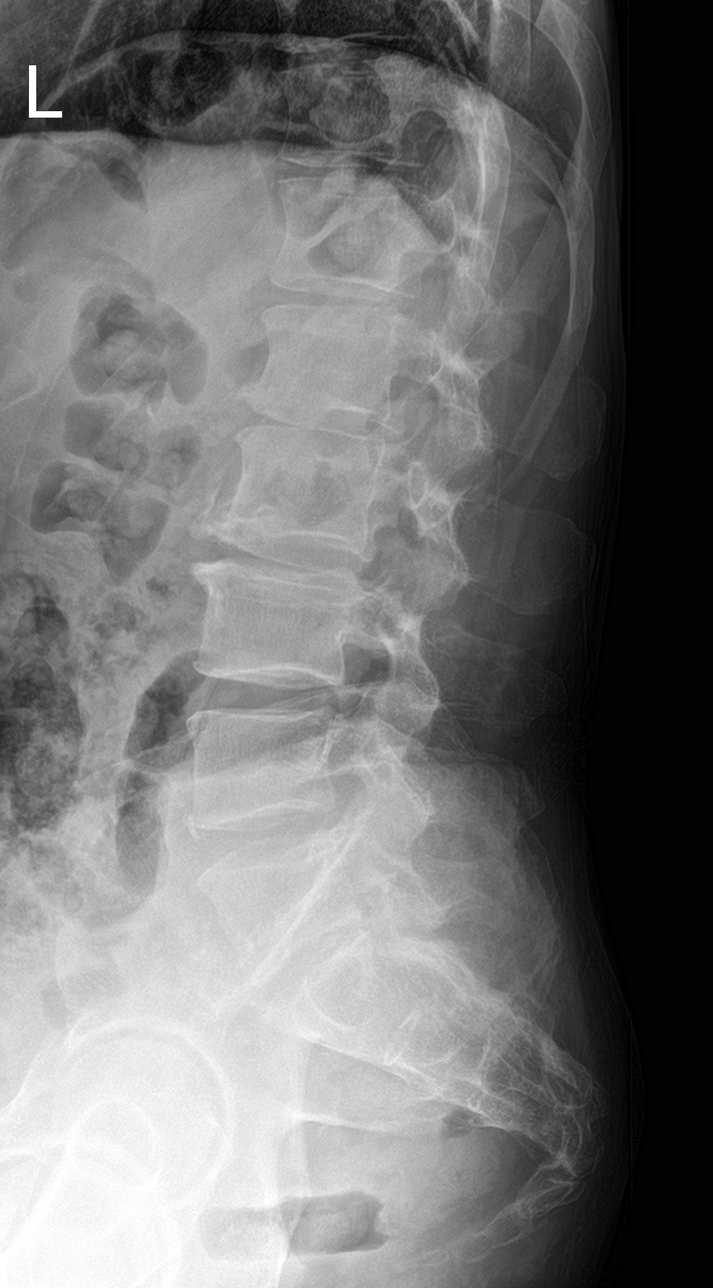

[l-spine spot]
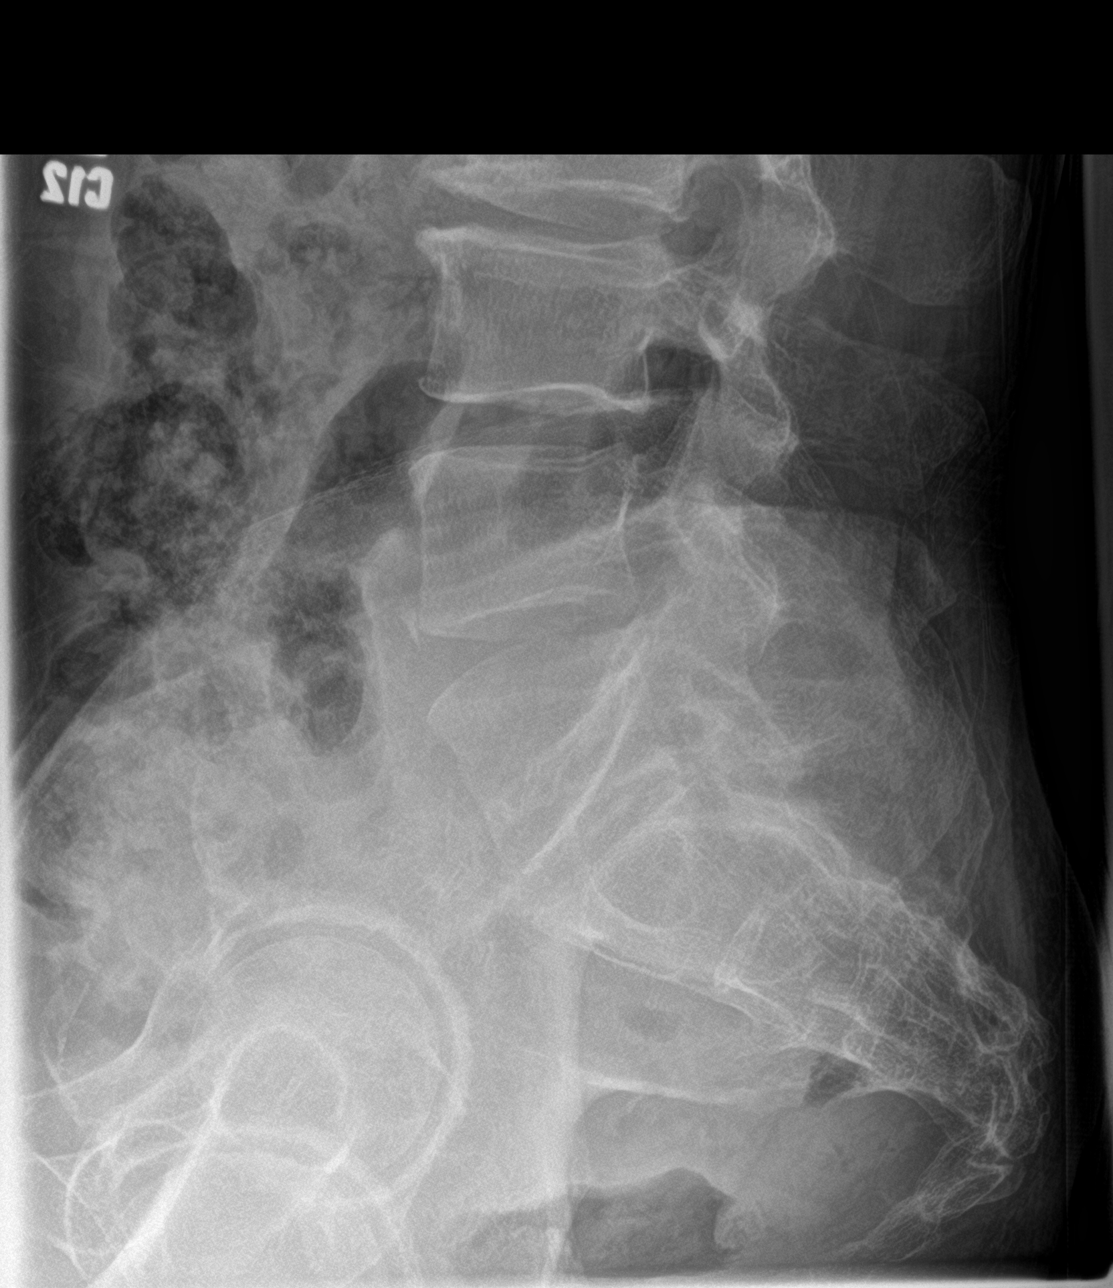

[5 of 5 positions shown; findings below may reference images not displayed]

FINDINGS: Five lumbar type vertebral bodies are well visualized. Vertebral
body height is well maintained. No pars defects are noted. Mild
degenerative changes are seen primarily at L2-3. No anterolisthesis
is noted. No soft tissue changes are noted.
IMPRESSION: Mild degenerative change without acute abnormality.

## 2021-05-08 ENCOUNTER — Telehealth: Payer: Self-pay

## 2021-05-08 NOTE — Telephone Encounter (Signed)
Pt called in to return call about scheduling his AWV.

## 2021-05-08 NOTE — Telephone Encounter (Signed)
Contacted pt to schedule Medicare Wellness pt didn't answer lvm   °

## 2021-05-09 NOTE — Telephone Encounter (Signed)
Returned pt call and scheduled AWV °

## 2021-05-13 ENCOUNTER — Ambulatory Visit (INDEPENDENT_AMBULATORY_CARE_PROVIDER_SITE_OTHER): Payer: Medicare HMO | Admitting: Nurse Practitioner

## 2021-05-13 ENCOUNTER — Encounter: Payer: Self-pay | Admitting: Nurse Practitioner

## 2021-05-13 ENCOUNTER — Other Ambulatory Visit: Payer: Self-pay

## 2021-05-13 DIAGNOSIS — Z0000 Encounter for general adult medical examination without abnormal findings: Secondary | ICD-10-CM | POA: Diagnosis not present

## 2021-05-13 NOTE — Patient Instructions (Addendum)
Timothy [name] , Thank you for taking time to come for your Medicare Wellness Visit. I appreciate your ongoing commitment to your health goals. Please review the following plan we discussed and let me know if I can assist you in the future.   These are the goals we discussed:  Goals   None     This is a list of the screening recommended for you and due dates:  Health Maintenance  Topic Date Due   COVID-19 Vaccine (1) Never done   Colon Cancer Screening  11/05/2013   Zoster (Shingles) Vaccine (2 of 2) 04/17/2021   Tetanus Vaccine  01/30/2028   Pneumonia Vaccine  Completed   Flu Shot  Completed   Hepatitis C Screening: USPSTF Recommendation to screen - Ages 18-79 yo.  Completed   HPV Vaccine  Aged Out    Fall Prevention in the Home, Adult Falls can cause injuries and can happen to people of all ages. There are many things you can do to make your home safe and to help prevent falls. Ask for help when making these changes. What actions can I take to prevent falls? General Instructions Use good lighting in all rooms. Replace any light bulbs that burn out. Turn on the lights in dark areas. Use night-lights. Keep items that you use often in easy-to-reach places. Lower the shelves around your home if needed. Set up your furniture so you have a clear path. Avoid moving your furniture around. Do not have throw rugs or other things on the floor that can make you trip. Avoid walking on wet floors. If any of your floors are uneven, fix them. Add color or contrast paint or tape to clearly mark and help you see: Grab bars or handrails. First and last steps of staircases. Where the edge of each step is. If you use a stepladder: Make sure that it is fully opened. Do not climb a closed stepladder. Make sure the sides of the stepladder are locked in place. Ask someone to hold the stepladder while you use it. Know where your pets are when moving through your home. What can I do in the  bathroom?   Keep the floor dry. Clean up any water on the floor right away. Remove soap buildup in the tub or shower. Use nonskid mats or decals on the floor of the tub or shower. Attach bath mats securely with double-sided, nonslip rug tape. If you need to sit down in the shower, use a plastic, nonslip stool. Install grab bars by the toilet and in the tub and shower. Do not use towel bars as grab bars. What can I do in the bedroom? Make sure that you have a light by your bed that is easy to reach. Do not use any sheets or blankets for your bed that hang to the floor. Have a firm chair with side arms that you can use for support when you get dressed. What can I do in the kitchen? Clean up any spills right away. If you need to reach something above you, use a step stool with a grab bar. Keep electrical cords out of the way. Do not use floor polish or wax that makes floors slippery. What can I do with my stairs? Do not leave any items on the stairs. Make sure that you have a light switch at the top and the bottom of the stairs. Make sure that there are handrails on both sides of the stairs. Fix handrails that are broken or  loose. Install nonslip stair treads on all your stairs. Avoid having throw rugs at the top or bottom of the stairs. Choose a carpet that does not hide the edge of the steps on the stairs. Check carpeting to make sure that it is firmly attached to the stairs. Fix carpet that is loose or worn. What can I do on the outside of my home? Use bright outdoor lighting. Fix the edges of walkways and driveways and fix any cracks. Remove anything that might make you trip as you walk through a door, such as a raised step or threshold. Trim any bushes or trees on paths to your home. Check to see if handrails are loose or broken and that both sides of all steps have handrails. Install guardrails along the edges of any raised decks and porches. Clear paths of anything that can make  you trip, such as tools or rocks. Have leaves, snow, or ice cleared regularly. Use sand or salt on paths during winter. Clean up any spills in your garage right away. This includes grease or oil spills. What other actions can I take? Wear shoes that: Have a low heel. Do not wear high heels. Have rubber bottoms. Feel good on your feet and fit well. Are closed at the toe. Do not wear open-toe sandals. Use tools that help you move around if needed. These include: Canes. Walkers. Scooters. Crutches. Review your medicines with your doctor. Some medicines can make you feel dizzy. This can increase your chance of falling. Ask your doctor what else you can do to help prevent falls. Where to find more information Centers for Disease Control and Prevention, [employer]: http://www.wolf.info/ National Institute on Aging: http://kim-miller.com/ Contact a doctor if: You are afraid of falling at home. You feel weak, drowsy, or dizzy at home. You fall at home. Summary There are many simple things that you can do to make your home safe and to help prevent falls. Ways to make your home safe include removing things that can make you trip and installing grab bars in the bathroom. Ask for help when making these changes in your home. This information is not intended to replace advice given to you by your health care provider. Make sure you discuss any questions you have with your health care provider. Document Revised: 11/08/2019 Document Reviewed: 11/08/2019 Elsevier Patient Education  [place] Maintenance, Male Adopting a healthy lifestyle and getting preventive care are important in promoting health and wellness. Ask your health care provider about: The right schedule for you to have regular tests and exams. Things you can do on your own to prevent diseases and keep yourself healthy. What should I know about diet, weight, and exercise? Eat a healthy diet  Eat a diet that includes plenty of  vegetables, fruits, low-fat dairy products, and lean protein. Do not eat a lot of foods that are high in solid fats, added sugars, or sodium. Maintain a healthy weight Body mass index (BMI) is a measurement that can be used to identify possible weight problems. It estimates body fat based on height and weight. Your health care provider can help determine your BMI and help you achieve or maintain a healthy weight. Get regular exercise Get regular exercise. This is one of the most important things you can do for your health. Most adults should: Exercise for at least 150 minutes each week. The exercise should increase your heart rate and make you sweat (moderate-intensity exercise). Do strengthening exercises at least twice a  week. This is in addition to the moderate-intensity exercise. Spend less time sitting. Even light physical activity can be beneficial. Watch cholesterol and blood lipids Have your blood tested for lipids and cholesterol at 70 years of age, then have this test every 5 years. You may need to have your cholesterol levels checked more often if: Your lipid or cholesterol levels are high. You are older than 70 years of age. You are at high risk for heart disease. What should I know about cancer screening? Many types of cancers can be detected early and may often be prevented. Depending on your health history and family history, you may need to have cancer screening at various ages. This may include screening for: Colorectal cancer. Prostate cancer. Skin cancer. Lung cancer. What should I know about heart disease, diabetes, and high blood pressure? Blood pressure and heart disease High blood pressure causes heart disease and increases the risk of stroke. This is more likely to develop in people who have high blood pressure readings or are overweight. Talk with your health care provider about your target blood pressure readings. Have your blood pressure checked: Every 3-5 years  if you are 82-21 years of age. Every year if you are 50 years old or older. If you are between the ages of 42 and 19 and are a current or former smoker, ask your health care provider if you should have a one-time screening for abdominal aortic aneurysm (AAA). Diabetes Have regular diabetes screenings. This checks your fasting blood sugar level. Have the screening done: Once every three years after age 34 if you are at a normal weight and have a low risk for diabetes. More often and at a younger age if you are overweight or have a high risk for diabetes. What should I know about preventing infection? Hepatitis B If you have a higher risk for hepatitis B, you should be screened for this virus. Talk with your health care provider to find out if you are at risk for hepatitis B infection. Hepatitis C Blood testing is recommended for: Everyone born from [birth year] through 1964. Anyone with known risk factors for hepatitis C. Sexually transmitted infections (STIs) You should be screened each year for STIs, including gonorrhea and chlamydia, if: You are sexually active and are younger than 70 years of age. You are older than 70 years of age and your health care provider tells you that you are at risk for this type of infection. Your sexual activity has changed since you were last screened, and you are at increased risk for chlamydia or gonorrhea. Ask your health care provider if you are at risk. Ask your health care provider about whether you are at high risk for HIV. Your health care provider may recommend a prescription medicine to help prevent HIV infection. If you choose to take medicine to prevent HIV, you should first get tested for HIV. You should then be tested every 3 months for as long as you are taking the medicine. Follow these instructions at home: Alcohol use Do not drink alcohol if your health care provider tells you not to drink. If you drink alcohol: Limit how much you have to 0-2 drinks a  day. Know how much alcohol is in your drink. In the [place] one drink equals one 12 oz bottle of beer (355 mL), one 5 oz glass of wine (148 mL), or one 1 oz glass of hard liquor (44 mL). Lifestyle Do not use any products that contain nicotine or tobacco.  These products include cigarettes, chewing tobacco, and vaping devices, such as e-cigarettes. If you need help quitting, ask your health care provider. Do not use street drugs. Do not share needles. Ask your health care provider for help if you need support or information about quitting drugs. General instructions Schedule regular health, dental, and eye exams. Stay current with your vaccines. Tell your health care provider if: You often feel depressed. You have ever been abused or do not feel safe at home. Summary Adopting a healthy lifestyle and getting preventive care are important in promoting health and wellness. Follow your health care provider's instructions about healthy diet, exercising, and getting tested or screened for diseases. Follow your health care provider's instructions on monitoring your cholesterol and blood pressure. This information is not intended to replace advice given to you by your health care provider. Make sure you discuss any questions you have with your health care provider. Document Revised: 08/26/2020 Document Reviewed: 08/26/2020 Elsevier Patient Education  [place].  Steps to Quit Smoking Smoking tobacco is the leading cause of preventable death. It can affect almost every organ in the body. Smoking puts you and people around you at risk for many serious, long-lasting (chronic) diseases. Quitting smoking can be hard, but it is one of the best things that you can do for your health. It is never too late to quit. How do I get ready to quit? When you decide to quit smoking, make a plan to help you succeed. Before you quit: Pick a date to quit. Set a date within the next 2 weeks to give you time to  prepare. Write down the reasons why you are quitting. Keep this list in places where you will see it often. Tell your family, friends, and co-workers that you are quitting. Their support is important. Talk with your doctor about the choices that may help you quit. Find out if your health insurance will pay for these treatments. Know the people, places, things, and activities that make you want to smoke (triggers). Avoid them. What first steps can I take to quit smoking? Throw away all cigarettes at home, at work, and in your car. Throw away the things that you use when you smoke, such as ashtrays and lighters. Clean your car. Make sure to empty the ashtray. Clean your home, including curtains and carpets. What can I do to help me quit smoking? Talk with your doctor about taking medicines and seeing a counselor at the same time. You are more likely to succeed when you do both. If you are pregnant or breastfeeding, talk with your doctor about counseling or other ways to quit smoking. Do not take medicine to help you quit smoking unless your doctor tells you to do so. To quit smoking: Quit right away Quit smoking totally, instead of slowly cutting back on how much you smoke over a period of time. Go to counseling. You are more likely to quit if you go to counseling sessions regularly. Take medicine You may take medicines to help you quit. Some medicines need a prescription, and some you can buy over-the-counter. Some medicines may contain a drug called nicotine to replace the nicotine in cigarettes. Medicines may: Help you to stop having the desire to smoke (cravings). Help to stop the problems that come when you stop smoking (withdrawal symptoms). Your doctor may ask you to use: Nicotine patches, gum, or lozenges. Nicotine inhalers or sprays. Non-nicotine medicine that is taken by mouth. Find resources Find resources and  other ways to help you quit smoking and remain smoke-free after you  quit. These resources are most helpful when you use them often. They include: Online chats with a Social worker. Phone quitlines. Printed Furniture conservator/restorer. Support groups or group counseling. Text messaging programs. Mobile phone apps. Use apps on your mobile phone or tablet that can help you stick to your quit plan. There are many free apps for mobile phones and tablets as well as websites. Examples include Quit Guide from the [employer] and smokefree.gov  What things can I do to make it easier to quit?  Talk to your family and friends. Ask them to support and encourage you. Call a phone quitline (1-800-QUIT-NOW), reach out to support groups, or work with a Social worker. Ask people who smoke to not smoke around you. Avoid places that make you want to smoke, such as: Bars. Parties. Smoke-break areas at work. Spend time with people who do not smoke. Lower the stress in your life. Stress can make you want to smoke. Try these things to help your stress: Getting regular exercise. Doing deep-breathing exercises. Doing yoga. Meditating. Doing a body scan. To do this, close your eyes, focus on one area of your body at a time from head to toe. Notice which parts of your body are tense. Try to relax the muscles in those areas. How will I feel when I quit smoking? Day 1 to 3 weeks Within the first 24 hours, you may start to have some problems that come from quitting tobacco. These problems are very bad 2-3 days after you quit, but they do not often last for more than 2-3 weeks. You may get these symptoms: Mood swings. Feeling restless, nervous, angry, or annoyed. Trouble concentrating. Dizziness. Strong desire for high-sugar foods and nicotine. Weight gain. Trouble pooping (constipation). Feeling like you may vomit (nausea). Coughing or a sore throat. Changes in how the medicines that you take for other issues work in your body. Depression. Trouble sleeping (insomnia). Week 3 and afterward After  the first 2-3 weeks of quitting, you may start to notice more positive results, such as: Better sense of smell and taste. Less coughing and sore throat. Slower heart rate. Lower blood pressure. Clearer skin. Better breathing. Fewer sick days. Quitting smoking can be hard. Do not give up if you fail the first time. Some people need to try a few times before they succeed. Do your best to stick to your quit plan, and talk with your doctor if you have any questions or concerns. Summary Smoking tobacco is the leading cause of preventable death. Quitting smoking can be hard, but it is one of the best things that you can do for your health. When you decide to quit smoking, make a plan to help you succeed. Quit smoking right away, not slowly over a period of time. When you start quitting, seek help from your doctor, family, or friends. This information is not intended to replace advice given to you by your health care provider. Make sure you discuss any questions you have with your health care provider. Document Revised: 12/13/2020 Document Reviewed: 06/25/2018 Elsevier Patient Education  [place].

## 2021-05-13 NOTE — Progress Notes (Signed)
Subjective:   Timothy [name] is a 71 y.o. male who presents for an Initial Medicare Annual Wellness Visit.  Review of Systems    Review of Systems  Constitutional: Negative.   HENT: Negative.    Eyes: Negative.   Respiratory: Negative.    Cardiovascular: Negative.   Skin: Negative.   Neurological: Negative.   Psychiatric/Behavioral: Negative.           Objective:    There were no vitals filed for this visit. There is no height or weight on file to calculate BMI.  Advanced Directives 12/30/2020 11/22/2017 07/09/2017 06/25/2017 06/04/2017 06/26/2014  Does Patient Have a Medical Advance Directive? No No No No No No  Would patient like information on creating a medical advance directive? - No - Patient declined No - Patient declined No - Patient declined - -    Current Medications (verified) Outpatient Encounter Medications as of 05/13/2021  Medication Sig   amLODipine (NORVASC) 2.5 MG tablet Take 1 tablet (2.5 mg total) by mouth daily.   baclofen (LIORESAL) 10 MG tablet Take 10 mg by mouth 3 (three) times daily.   cyproheptadine (PERIACTIN) 4 MG tablet Take 1 tab PO TID PRN for appetite   gabapentin (NEURONTIN) 300 MG capsule Take 1 capsule (300 mg total) by mouth 3 (three) times daily as needed.   HYDROcodone-acetaminophen (NORCO/VICODIN) 5-325 MG tablet    lisinopril-hydrochlorothiazide (ZESTORETIC) 20-12.5 MG tablet Take 2 tablets by mouth daily.   meloxicam (MOBIC) 7.5 MG tablet Take 2 tablets (15 mg total) by mouth daily.   NARCAN 4 MG/0.1ML LIQD nasal spray kit    No facility-administered encounter medications on file as of 05/13/2021.    Allergies (verified) Patient has no known allergies.   History: Past Medical History:  Diagnosis Date   Chronic kidney disease (CKD), stage II (mild)    Essential hypertension    Low back pain with radiation    Personal history of prostate cancer    Past Surgical History:  Procedure Laterality Date   PROSTATE SURGERY     Family  History  Problem Relation Age of Onset   Hypertension Mother    Cancer Neg Hx    Diabetes Neg Hx    CVA Neg Hx    Social History   Socioeconomic History   Marital status: Single    Spouse name: Not on file   Number of children: Not on file   Years of education: Not on file   Highest education level: Not on file  Occupational History   Not on file  Tobacco Use   Smoking status: Some Days    Types: Cigars   Smokeless tobacco: Never  Vaping Use   Vaping Use: Never used  Substance and Sexual Activity   Alcohol use: Yes   Drug use: Yes    Types: Marijuana   Sexual activity: Not Currently  Other Topics Concern   Not on file  Social History Narrative   Not on file   Social Determinants of Health   Financial Resource Strain: Not on file  Food Insecurity: Not on file  Transportation Needs: Not on file  Physical Activity: Not on file  Stress: Not on file  Social Connections: Not on file    Tobacco Counseling Ready to quit: Not Answered Counseling given: Not Answered   Clinical Intake:                 Diabetic?no         Activities of Daily  Living No flowsheet data found.  Patient Care Team: Charlott Rakes, MD as PCP - General (Family Medicine)  Indicate any recent Medical Services you may have received from other than [facility] providers in the past year (date may be approximate).     Assessment:   This is a routine wellness examination for Timothy [name].  Hearing/Vision screen No results found.  Dietary issues and exercise activities discussed:     Goals Addressed   None    Depression Screen PHQ 2/9 Scores 02/20/2021 07/22/2020 02/14/2020 05/29/2019 08/11/2018 04/11/2018 03/14/2018  PHQ - 2 Score 0 1 0 2 0 0 2  PHQ- 9 Score 0 3 - - 0 0 5    Fall Risk Fall Risk  02/20/2021 01/01/2021 05/01/2020 02/14/2020 08/11/2018  Falls in the past year? 0 0 0 0 0  Number falls in past yr: 0 0 0 0 0  Injury with Fall? 0 0 0 0 0  Follow up - - - - Falls evaluation  completed    FALL RISK PREVENTION PERTAINING TO THE HOME:  Any stairs in or around the home? Yes  If so, are there any without handrails? No  Home free of loose throw rugs in walkways, pet beds, electrical cords, etc? Yes  Adequate lighting in your home to reduce risk of falls? Yes   ASSISTIVE DEVICES UTILIZED TO PREVENT FALLS:  Life alert? No  Use of a cane, walker or w/c? No  Grab bars in the bathroom? Yes  Shower chair or bench in shower? No  Elevated toilet seat or a handicapped toilet? No   TIMED UP AND GO:  Was the test performed? No .      Cognitive Function: alert and oriented        Immunizations Immunization History  Administered Date(s) Administered   Influenza, High Dose Seasonal PF 12/20/2018   Influenza,inj,Quad PF,6+ Mos 06/25/2017, 01/26/2018, 02/14/2020, 01/01/2021   PNEUMOCOCCAL CONJUGATE-20 02/20/2021   Pneumococcal Polysaccharide-23 11/22/2017   Tdap 01/29/2018   Zoster Recombinat (Shingrix) 02/20/2021    TDAP status: Up to date  Flu Vaccine status: Up to date  Pneumococcal vaccine status: Up to date  Covid-19 vaccine status: Completed vaccines  Qualifies for Shingles Vaccine? Yes   Zostavax completed Yes   Shingrix Completed?: Yes  Screening Tests Health Maintenance  Topic Date Due   COVID-19 Vaccine (1) Never done   COLONOSCOPY (Pts 45-22yr Insurance coverage will need to be confirmed)  11/05/2013   Zoster Vaccines- Shingrix (2 of 2) 04/17/2021   TETANUS/TDAP  01/30/2028   Pneumonia Vaccine 70 Years old  Completed   INFLUENZA VACCINE  Completed   Hepatitis C Screening  Completed   HPV VACCINES  Aged Out    Health Maintenance  Health Maintenance Due  Topic Date Due   COVID-19 Vaccine (1) Never done   COLONOSCOPY (Pts 45-440yrInsurance coverage will need to be confirmed)  11/05/2013   Zoster Vaccines- Shingrix (2 of 2) 04/17/2021    Colorectal cancer screening: Type of screening: Colonoscopy. Completed 2022. Repeat  every 5 years  Lung Cancer Screening: (Low Dose CT Chest recommended if Age 70-80ears, 30 pack-year currently smoking OR have quit w/in 15years.) does not qualify.   Lung Cancer Screening Referral: completed  Additional Screening:  Hepatitis C Screening: does qualify; Completed   Vision Screening: Recommended annual ophthalmology exams for early detection of glaucoma and other disorders of the eye. Is the patient up to date with their annual eye exam?  Yes  Who is the  provider or what is the name of the office in which the patient attends annual eye exams? unknown If pt is not established with a provider, would they like to be referred to a provider to establish care?  completed .   Dental Screening: Recommended annual dental exams for proper oral hygiene  Community Resource Referral / Chronic Care Management: CRR required this visit?  Yes   CCM required this visit?  No      Plan:     I have personally reviewed and noted the following in the patients chart:   Medical and social history Use of alcohol, tobacco or illicit drugs  Current medications and supplements including opioid prescriptions. Patient is currently taking opioid prescriptions. Information provided to patient regarding non-opioid alternatives. Patient advised to discuss non-opioid treatment plan with their provider. Functional ability and status Nutritional status Physical activity Advanced directives List of other physicians Hospitalizations, surgeries, and ER visits in previous 12 months Vitals Screenings to include cognitive, depression, and falls Referrals and appointments  In addition, I have reviewed and discussed with patient certain preventive protocols, quality metrics, and best practice recommendations. A written personalized care plan for preventive services as well as general preventive health recommendations were provided to patient.     Fenton Foy, NP   05/13/2021     I connected  with  Timothy [name] on 05/13/21 by telemedicine application and verified that I am speaking with the correct person using two identifiers.   I discussed the limitations of evaluation and management by telemedicine. The patient expressed understanding and agreed to proceed.

## 2021-05-27 ENCOUNTER — Ambulatory Visit: Payer: Medicare HMO | Attending: Family Medicine | Admitting: Family Medicine

## 2021-05-27 ENCOUNTER — Other Ambulatory Visit: Payer: Self-pay

## 2021-05-27 ENCOUNTER — Encounter: Payer: Self-pay | Admitting: Family Medicine

## 2021-05-27 VITALS — BP 159/91 | HR 67 | Ht 66.0 in | Wt 135.8 lb

## 2021-05-27 DIAGNOSIS — I10 Essential (primary) hypertension: Secondary | ICD-10-CM

## 2021-05-27 DIAGNOSIS — M5441 Lumbago with sciatica, right side: Secondary | ICD-10-CM

## 2021-05-27 DIAGNOSIS — F17210 Nicotine dependence, cigarettes, uncomplicated: Secondary | ICD-10-CM

## 2021-05-27 DIAGNOSIS — Z1211 Encounter for screening for malignant neoplasm of colon: Secondary | ICD-10-CM | POA: Diagnosis not present

## 2021-05-27 DIAGNOSIS — G8929 Other chronic pain: Secondary | ICD-10-CM

## 2021-05-27 MED ORDER — LISINOPRIL-HYDROCHLOROTHIAZIDE 20-12.5 MG PO TABS: 2.0000 | ORAL_TABLET | Freq: Every day | ORAL | 1 refills | Status: DC

## 2021-05-27 MED ORDER — DULOXETINE HCL 60 MG PO CPEP: 60.0000 mg | ORAL_CAPSULE | Freq: Every day | ORAL | 3 refills | Status: DC

## 2021-05-27 MED ORDER — AMLODIPINE BESYLATE 2.5 MG PO TABS: 2.5000 mg | ORAL_TABLET | Freq: Every day | ORAL | 1 refills | Status: DC

## 2021-05-27 NOTE — Progress Notes (Signed)
Subjective:  Patient ID: Timothy [name], male    DOB: [birth year]  Age: 71 y.o. MRN: [id number]  CC: Hypertension   HPI DAREL RICKETTS is a 71 y.o. year old male with a history of Hypertension, chronic low back pain, history of Prostate ca  Interval History: He has pain in his lower back radiating down his RLE 9/10. He has no paresthesia. Pain is worse with sitting and relieved by standing He sees [facility] and is on Percocet, gabapentin, meloxicam. Denies recent falls, does not ambulate with the aid of a cane, has no loss of sphincteric function.  BP is elevated and he has been out of his antihypertensives and needs refills. PSA was done with his labs in 02/2021 and was normal.  Past Medical History:  Diagnosis Date   Chronic kidney disease (CKD), stage II (mild)    Essential hypertension    Low back pain with radiation    Personal history of prostate cancer     Past Surgical History:  Procedure Laterality Date   PROSTATE SURGERY      Family History  Problem Relation Age of Onset   Hypertension Mother    Cancer Neg Hx    Diabetes Neg Hx    CVA Neg Hx     No Known Allergies  Outpatient Medications Prior to Visit  Medication Sig Dispense Refill   baclofen (LIORESAL) 10 MG tablet Take 10 mg by mouth 3 (three) times daily.     cyproheptadine (PERIACTIN) 4 MG tablet Take 1 tab PO TID PRN for appetite 30 tablet 0   gabapentin (NEURONTIN) 300 MG capsule Take 1 capsule (300 mg total) by mouth 3 (three) times daily as needed. 270 capsule 1   HYDROcodone-acetaminophen (NORCO/VICODIN) 5-325 MG tablet      meloxicam (MOBIC) 7.5 MG tablet Take 2 tablets (15 mg total) by mouth daily. 60 tablet 3   NARCAN 4 MG/0.1ML LIQD nasal spray kit      amLODipine (NORVASC) 2.5 MG tablet Take 1 tablet (2.5 mg total) by mouth daily. 90 tablet 1   lisinopril-hydrochlorothiazide (ZESTORETIC) 20-12.5 MG tablet Take 2 tablets by mouth daily. 180 tablet 1   No facility-administered  medications prior to visit.     ROS Review of Systems  Constitutional:  Negative for activity change and appetite change.  HENT:  Negative for sinus pressure and sore throat.   Eyes:  Negative for visual disturbance.  Respiratory:  Negative for cough, chest tightness and shortness of breath.   Cardiovascular:  Negative for chest pain and leg swelling.  Gastrointestinal:  Negative for abdominal distention, abdominal pain, constipation and diarrhea.  Endocrine: Negative.   Genitourinary:  Negative for dysuria.  Musculoskeletal:  Positive for back pain. Negative for joint swelling and myalgias.  Skin:  Negative for rash.  Allergic/Immunologic: Negative.   Neurological:  Negative for weakness, light-headedness and numbness.  Psychiatric/Behavioral:  Negative for dysphoric mood and suicidal ideas.    Objective:  BP (!) 159/91    Pulse 67    Ht '5\' 6"'  (1.676 m)    Wt 135 lb 12.8 oz (61.6 kg)    SpO2 99%    BMI 21.92 kg/m   BP/Weight 05/27/2021 02/20/2021 4/65/0354  Systolic BP 656 812 751  Diastolic BP 91 88 66  Wt. (Lbs) 135.8 145.2 136.4  BMI 21.92 23.44 22.02      Physical Exam Constitutional:      Appearance: He is well-developed.  Cardiovascular:     Rate  and Rhythm: Normal rate.     Heart sounds: Normal heart sounds. No murmur heard. Pulmonary:     Effort: Pulmonary effort is normal.     Breath sounds: Normal breath sounds. No wheezing or rales.  Chest:     Chest wall: No tenderness.  Abdominal:     General: Bowel sounds are normal. There is no distension.     Palpations: Abdomen is soft. There is no mass.     Tenderness: There is no abdominal tenderness.  Musculoskeletal:        General: Normal range of motion.     Right lower leg: No edema.     Left lower leg: No edema.     Comments: Tenderness to palpation of lumbar spine more on the left lateral lumbar region Negative straight leg raise bilaterally  Neurological:     Mental Status: He is alert and oriented to  person, place, and time.  Psychiatric:        Mood and Affect: Mood normal.    CMP Latest Ref Rng & Units 12/30/2020 07/22/2020 07/21/2020  Glucose 70 - 99 mg/dL 93 96 120(H)  BUN 8 - 23 mg/dL '18 10 18  ' Creatinine 0.61 - 1.24 mg/dL 1.39(H) 1.17 1.66(H)  Sodium 135 - 145 mmol/L 138 136 136  Potassium 3.5 - 5.1 mmol/L 4.6 3.8 3.6  Chloride 98 - 111 mmol/L 104 101 100  CO2 22 - 32 mmol/L 26 - 27  Calcium 8.9 - 10.3 mg/dL 9.4 9.5 9.1  Total Protein 6.5 - 8.1 g/dL 7.2 7.6 -  Total Bilirubin 0.3 - 1.2 mg/dL 0.8 0.9 -  Alkaline Phos 38 - 126 U/L 44 55 -  AST 15 - 41 U/L 20 22 -  ALT 0 - 44 U/L 16 - -    Lipid Panel     Component Value Date/Time   CHOL 126 07/22/2020 1532   TRIG 97 07/22/2020 1532   HDL 41 07/22/2020 1532   CHOLHDL 3.1 07/22/2020 1532   CHOLHDL 5.0 Ratio 05/06/2010 2038   VLDL 37 05/06/2010 2038   LDLCALC 67 07/22/2020 1532    CBC    Component Value Date/Time   WBC 8.1 12/30/2020 1656   RBC 4.11 (L) 12/30/2020 1656   HGB 13.3 12/30/2020 1656   HGB 13.8 02/10/2019 1122   HCT 39.6 12/30/2020 1656   HCT 41.7 02/10/2019 1122   PLT 170 12/30/2020 1656   PLT 198 02/10/2019 1122   MCV 96.4 12/30/2020 1656   MCV 94 02/10/2019 1122   MCH 32.4 12/30/2020 1656   MCHC 33.6 12/30/2020 1656   RDW 14.6 12/30/2020 1656   RDW 13.0 02/10/2019 1122   LYMPHSABS 1.5 12/30/2020 1656   LYMPHSABS 2.8 02/10/2019 1122   MONOABS 0.7 12/30/2020 1656   EOSABS 0.3 12/30/2020 1656   EOSABS 0.5 (H) 02/10/2019 1122   BASOSABS 0.0 12/30/2020 1656   BASOSABS 0.0 02/10/2019 1122    No results found for: HGBA1C  Assessment & Plan:  1. Essential hypertension Uncontrolled due to the fact that he is yet to take his antihypertensive as he is fasting Resume antihypertensive We will check blood pressure at next visit Counseled on blood pressure goal of less than 130/80, low-sodium, DASH diet, medication compliance, 150 minutes of moderate intensity exercise per week. Discussed medication  compliance, adverse effects. - LP+Non-HDL Cholesterol - CMP14+EGFR - amLODipine (NORVASC) 2.5 MG tablet; Take 1 tablet (2.5 mg total) by mouth daily.  Dispense: 90 tablet; Refill: 1 - lisinopril-hydrochlorothiazide (ZESTORETIC) 20-12.5  MG tablet; Take 2 tablets by mouth daily.  Dispense: 180 tablet; Refill: 1  2. Chronic bilateral low back pain with right-sided sciatica Uncontrolled Cymbalta added to regimen PT will be beneficial Continue with [facility] Advised to apply heat or ice whichever is tolerated to painful areas. Counseled on evidence of improvement in pain control with regards to yoga, water aerobics, massage, home physical therapy, exercise as tolerated. - DULoxetine (CYMBALTA) 60 MG capsule; Take 1 capsule (60 mg total) by mouth daily. For back pain  Dispense: 30 capsule; Refill: 3 - Ambulatory referral to Physical Therapy  3. Smoking greater than 20 pack years Spent 3 minutes counseling on smoking cessation but he continues to smoke cigarettes and is not ready to quit at this time - CT CHEST LUNG CA SCREEN LOW DOSE W/O CM; Future    Meds ordered this encounter  Medications   DULoxetine (CYMBALTA) 60 MG capsule    Sig: Take 1 capsule (60 mg total) by mouth daily. For back pain    Dispense:  30 capsule    Refill:  3   amLODipine (NORVASC) 2.5 MG tablet    Sig: Take 1 tablet (2.5 mg total) by mouth daily.    Dispense:  90 tablet    Refill:  1   lisinopril-hydrochlorothiazide (ZESTORETIC) 20-12.5 MG tablet    Sig: Take 2 tablets by mouth daily.    Dispense:  180 tablet    Refill:  1    Follow-up: No follow-ups on file.       Charlott Rakes, MD, FAAFP. [facility] [place], [place]   05/27/2021, 9:11 AM

## 2021-05-27 NOTE — Progress Notes (Signed)
Having pain in leg right leg and lower back. Needs medication refills. No BP medication in 5 days.

## 2021-05-28 LAB — CMP14+EGFR
ALT: 11 IU/L (ref 0–44)
AST: 17 IU/L (ref 0–40)
Albumin/Globulin Ratio: 1.7 (ref 1.2–2.2)
Albumin: 4.7 g/dL (ref 3.8–4.8)
Alkaline Phosphatase: 50 IU/L (ref 44–121)
BUN/Creatinine Ratio: 10 (ref 10–24)
BUN: 14 mg/dL (ref 8–27)
Bilirubin Total: 0.7 mg/dL (ref 0.0–1.2)
CO2: 23 mmol/L (ref 20–29)
Calcium: 9.5 mg/dL (ref 8.6–10.2)
Chloride: 103 mmol/L (ref 96–106)
Creatinine, Ser: 1.34 mg/dL — ABNORMAL HIGH (ref 0.76–1.27)
Globulin, Total: 2.7 g/dL (ref 1.5–4.5)
Glucose: 86 mg/dL (ref 70–99)
Potassium: 4.6 mmol/L (ref 3.5–5.2)
Sodium: 143 mmol/L (ref 134–144)
Total Protein: 7.4 g/dL (ref 6.0–8.5)
eGFR: 57 mL/min/{1.73_m2} — ABNORMAL LOW (ref >59)

## 2021-05-28 LAB — LP+NON-HDL CHOLESTEROL
Cholesterol, Total: 128 mg/dL (ref 100–199)
HDL: 39 mg/dL — ABNORMAL LOW (ref >39)
LDL Chol Calc (NIH): 71 mg/dL (ref 0–99)
Total Non-HDL-Chol (LDL+VLDL): 89 mg/dL (ref 0–129)
Triglycerides: 97 mg/dL (ref 0–149)
VLDL Cholesterol Cal: 18 mg/dL (ref 5–40)

## 2021-05-30 ENCOUNTER — Telehealth: Payer: Self-pay

## 2021-05-30 NOTE — Telephone Encounter (Signed)
Patient name and DOB has been verified Patient was informed of lab results. Patient had no questions.  

## 2021-05-30 NOTE — Telephone Encounter (Signed)
-----   Message from Charlott Rakes, MD sent at 05/28/2021  1:36 PM EST ----- Please inform the patient that labs are stable.

## 2021-06-12 ENCOUNTER — Other Ambulatory Visit: Payer: Self-pay

## 2021-06-12 ENCOUNTER — Ambulatory Visit (HOSPITAL_COMMUNITY)
Admission: RE | Admit: 2021-06-12 | Discharge: 2021-06-12 | Disposition: A | Discharge status: Home / Self Care | Payer: Medicare HMO | Source: Ambulatory Visit | Attending: Family Medicine | Admitting: Family Medicine

## 2021-06-12 DIAGNOSIS — F17210 Nicotine dependence, cigarettes, uncomplicated: Secondary | ICD-10-CM | POA: Diagnosis present

## 2021-06-16 ENCOUNTER — Ambulatory Visit: Payer: Medicare HMO | Attending: Family Medicine | Admitting: Physical Therapy

## 2021-06-16 NOTE — Therapy (Incomplete)
OUTPATIENT PHYSICAL THERAPY THORACOLUMBAR EVALUATION   Patient Name: Timothy [name] MRN: [id number] DOB:May 13, 1950, 71 y.o., male Today's Date: 06/16/2021    Past Medical History:  Diagnosis Date   Chronic kidney disease (CKD), stage II (mild)    Essential hypertension    Low back pain with radiation    Personal history of prostate cancer    Past Surgical History:  Procedure Laterality Date   PROSTATE SURGERY     Patient Active Problem List   Diagnosis Date Noted   Anorexia 07/22/2020   Syncope 07/22/2020   Loss of weight 07/22/2020   Essential hypertension 07/14/2018   Benign hypertension with chronic kidney disease, stage II 07/14/2018   Hematuria, gross 01/31/2018   Scalp lesion 08/20/2017   History of prostate cancer 07/07/2017   Chronic bilateral low back pain with right-sided sciatica 07/07/2017   History of hypertension 07/07/2017    PCP: Charlott Rakes, MD  REFERRING PROVIDER: Charlott Rakes, MD  REFERRING DIAG: Chronic bilateral low back pain with right-sided sciatica  THERAPY DIAG:  No diagnosis found.  ONSET DATE: ***  SUBJECTIVE:               SUBJECTIVE STATEMENT: ***  PERTINENT HISTORY:  ***  PAIN:  Are you having pain? {yes/no:20286} NPRS scale: ***/10 Pain location: *** Pain orientation: {Pain Orientation:25161}  PAIN TYPE: {type:313116} Pain description: {PAIN DESCRIPTION:21022940}  Aggravating factors: *** Relieving factors: ***  PRECAUTIONS: {Therapy precautions:24002}  WEIGHT BEARING RESTRICTIONS {Yes ***/No:24003}  FALLS:  Has patient fallen in last 6 months? {yes/no:20286}, Number of falls: ***  LIVING ENVIRONMENT: Lives with: {[facility] lives with:25569::"lives with their family"} Lives in: {Lives in:25570} Stairs: {yes/no:20286}; {Stairs:24000} Has following equipment at home: {Assistive devices:23999}  OCCUPATION: ***  PLOF: {PLOF:24004}  PATIENT GOALS: ***   OBJECTIVE:  DIAGNOSTIC FINDINGS:  ***  PATIENT  SURVEYS:  FOTO ***  SCREENING FOR RED FLAGS: Negative  COGNITION:  Overall cognitive status: {cognition:24006}     SENSATION:  Light touch: {intact/deficits:24005}  MUSCLE LENGTH: ***  POSTURE:  ***  PALPATION: ***  LUMBAR AROM  A/PROM AROM  06/16/2021  Flexion   Extension   Right lateral flexion   Left lateral flexion   Right rotation   Left rotation    LE AROM/PROM:   ***  LE MMT:  MMT Right 06/16/2021 Left 06/16/2021  Hip flexion    Hip extension    Hip abduction    Knee flexion    Knee extension    Ankle dorsiflexion    Ankle plantarflexion    Ankle inversion    Ankle eversion     LUMBAR SPECIAL TESTS:  {lumbar special test:25242}  FUNCTIONAL TESTS:  {Functional tests:24029}  GAIT: Distance walked: *** Assistive device utilized: {Assistive devices:23999} Level of assistance: {Levels of assistance:24026} Comments: ***   TODAY'S TREATMENT  ***  PATIENT EDUCATION:  Education details: *** Person educated: {Person educated:25204} Education method: {Education Method:25205} Education comprehension: {Education Comprehension:25206}  HOME EXERCISE PROGRAM: ***   ASSESSMENT: CLINICAL IMPRESSION: Patient is a 71 y.o. male who was seen today for physical therapy evaluation and treatment for ***.    OBJECTIVE IMPAIRMENTS {opptimpairments:25111}.   ACTIVITY LIMITATIONS {activity limitations:25113}.   PERSONAL FACTORS {Personal factors:25162} are also affecting patient's functional outcome.    REHAB POTENTIAL: {rehabpotential:25112}  CLINICAL DECISION MAKING: {clinical decision making:25114}  EVALUATION COMPLEXITY: {Evaluation complexity:25115}   GOALS: Goals reviewed with patient? {yes/no:20286}  SHORT TERM GOALS:  STG Name Target Date Goal status  1 *** Baseline:  {follow up:25551} {GOALSTATUS:25110}  2 *** Baseline:  {follow up:25551} {GOALSTATUS:25110}  3 *** Baseline: {follow up:25551} {GOALSTATUS:25110}  4  *** Baseline: {follow up:25551} {GOALSTATUS:25110}   LONG TERM GOALS:   LTG Name Target Date Goal status  1 *** Baseline: {follow up:25551} {GOALSTATUS:25110}  2 *** Baseline: {follow up:25551} {GOALSTATUS:25110}  3 *** Baseline: {follow up:25551} {GOALSTATUS:25110}  4 *** Baseline: {follow up:25551} {GOALSTATUS:25110}    PLAN: PT FREQUENCY: {rehab frequency:25116}  PT DURATION: {rehab duration:25117}  PLANNED INTERVENTIONS: {rehab planned interventions:25118::"Therapeutic exercises","Therapeutic activity","Neuro Muscular re-education","Balance training","Gait training","Patient/Family education","Joint mobilization"}  PLAN FOR NEXT SESSION: ***   Bobette Mo, PT 06/16/2021, 7:59 AM

## 2021-10-24 DIAGNOSIS — R030 Elevated blood-pressure reading, without diagnosis of hypertension: Secondary | ICD-10-CM | POA: Diagnosis not present

## 2021-10-24 DIAGNOSIS — Z79899 Other long term (current) drug therapy: Secondary | ICD-10-CM | POA: Diagnosis not present

## 2021-10-24 DIAGNOSIS — R7989 Other specified abnormal findings of blood chemistry: Secondary | ICD-10-CM | POA: Diagnosis not present

## 2021-10-24 DIAGNOSIS — Z6821 Body mass index (BMI) 21.0-21.9, adult: Secondary | ICD-10-CM | POA: Diagnosis not present

## 2021-10-24 DIAGNOSIS — G894 Chronic pain syndrome: Secondary | ICD-10-CM | POA: Diagnosis not present

## 2021-10-24 DIAGNOSIS — M5136 Other intervertebral disc degeneration, lumbar region: Secondary | ICD-10-CM | POA: Diagnosis not present

## 2021-11-24 DIAGNOSIS — Z6820 Body mass index (BMI) 20.0-20.9, adult: Secondary | ICD-10-CM | POA: Diagnosis not present

## 2021-11-24 DIAGNOSIS — Z79899 Other long term (current) drug therapy: Secondary | ICD-10-CM | POA: Diagnosis not present

## 2021-11-24 DIAGNOSIS — R030 Elevated blood-pressure reading, without diagnosis of hypertension: Secondary | ICD-10-CM | POA: Diagnosis not present

## 2021-11-24 DIAGNOSIS — G894 Chronic pain syndrome: Secondary | ICD-10-CM | POA: Diagnosis not present

## 2021-11-24 DIAGNOSIS — M5136 Other intervertebral disc degeneration, lumbar region: Secondary | ICD-10-CM | POA: Diagnosis not present

## 2021-11-27 DIAGNOSIS — Z79899 Other long term (current) drug therapy: Secondary | ICD-10-CM | POA: Diagnosis not present

## 2021-12-09 ENCOUNTER — Ambulatory Visit: Payer: Medicare HMO | Attending: Family Medicine | Admitting: Family Medicine

## 2021-12-09 ENCOUNTER — Encounter: Payer: Self-pay | Admitting: Family Medicine

## 2021-12-09 VITALS — BP 159/83 | HR 68 | Ht 64.0 in | Wt 137.0 lb

## 2021-12-09 DIAGNOSIS — I10 Essential (primary) hypertension: Secondary | ICD-10-CM

## 2021-12-09 DIAGNOSIS — G8929 Other chronic pain: Secondary | ICD-10-CM

## 2021-12-09 DIAGNOSIS — Z8546 Personal history of malignant neoplasm of prostate: Secondary | ICD-10-CM

## 2021-12-09 DIAGNOSIS — M25551 Pain in right hip: Secondary | ICD-10-CM | POA: Diagnosis not present

## 2021-12-09 DIAGNOSIS — Z1211 Encounter for screening for malignant neoplasm of colon: Secondary | ICD-10-CM

## 2021-12-09 DIAGNOSIS — Z23 Encounter for immunization: Secondary | ICD-10-CM | POA: Diagnosis not present

## 2021-12-09 DIAGNOSIS — M5441 Lumbago with sciatica, right side: Secondary | ICD-10-CM

## 2021-12-09 MED ORDER — DULOXETINE HCL 60 MG PO CPEP
60.0000 mg | ORAL_CAPSULE | Freq: Every day | ORAL | 3 refills | Status: DC
Start: 2021-12-09 — End: 2022-06-11

## 2021-12-09 MED ORDER — AMLODIPINE BESYLATE 2.5 MG PO TABS: 2.5000 mg | ORAL_TABLET | Freq: Every day | ORAL | 1 refills | Status: DC

## 2021-12-09 MED ORDER — LISINOPRIL-HYDROCHLOROTHIAZIDE 20-12.5 MG PO TABS: 2.0000 | ORAL_TABLET | Freq: Every day | ORAL | 1 refills | Status: DC

## 2021-12-09 MED ORDER — ZOSTER VAC RECOMB ADJUVANTED 50 MCG/0.5ML IM SUSR: 0.5000 mL | Freq: Once | INTRAMUSCULAR | 1 refills | Status: AC

## 2021-12-09 NOTE — Patient Instructions (Signed)
Please call to schedule your colonoscopy appointment as the GI office has been trying to reach you: [facility] [address] [place], [place] [address] PH# [phone]

## 2021-12-09 NOTE — Progress Notes (Signed)
Subjective:  Patient ID: Timothy [name], male    DOB: 07/04/1950  Age: 70 y.o. MRN: [id number]  CC: Hypertension   HPI Timothy [name] is a 71 y.o. year old male with a history of Hypertension, chronic low back pain, history of Prostate ca.  Interval History: He complains of right hp pain worse with prolonged sitting and relieved by moving around. Pain starts in lumbar region and radiates to his right hip and some other times radiates down his right lower extremity. Pain has been present for a few month. Pain causes him to limp He takes Baclofen, Percocet, Gabapentin which he receives from the Pain clinic. X-ray lumbar spine from 01/2019 revealed mild degenerative change without acute abnormality. BP is elevated and he has been out of his antihypertensives.  Past Medical History:  Diagnosis Date   Chronic kidney disease (CKD), stage II (mild)    Essential hypertension    Low back pain with radiation    Personal history of prostate cancer     Past Surgical History:  Procedure Laterality Date   PROSTATE SURGERY      Family History  Problem Relation Age of Onset   Hypertension Mother    Cancer Neg Hx    Diabetes Neg Hx    CVA Neg Hx     Social History   Socioeconomic History   Marital status: Single    Spouse name: Not on file   Number of children: Not on file   Years of education: Not on file   Highest education level: Not on file  Occupational History   Not on file  Tobacco Use   Smoking status: Some Days    Types: Cigars   Smokeless tobacco: Never  Vaping Use   Vaping Use: Never used  Substance and Sexual Activity   Alcohol use: Yes   Drug use: Yes    Types: Marijuana   Sexual activity: Not Currently  Other Topics Concern   Not on file  Social History Narrative   Not on file   Social Determinants of Health   Financial Resource Strain: Low Risk  (05/13/2021)   Overall Financial Resource Strain (CARDIA)    Difficulty of Paying Living Expenses: Not  very hard  Food Insecurity: No Food Insecurity (05/13/2021)   Hunger Vital Sign    Worried About Running Out of Food in the Last Year: Never true    Ran Out of Food in the Last Year: Never true  Transportation Needs: No Transportation Needs (05/13/2021)   PRAPARE - Hydrologist (Medical): No    Lack of Transportation (Non-Medical): No  Physical Activity: Sufficiently Active (05/13/2021)   Exercise Vital Sign    Days of Exercise per Week: 7 days    Minutes of Exercise per Session: 30 min  Stress: No Stress Concern Present (05/13/2021)   [place]    Feeling of Stress : Not at all  Social Connections: Moderately Integrated (05/13/2021)   Social Connection and Isolation Panel [NHANES]    Frequency of Communication with Friends and Family: More than three times a week    Frequency of Social Gatherings with Friends and Family: More than three times a week    Attends Religious Services: More than 4 times per year    Active Member of [employer] or Organizations: Yes    Attends Music therapist: More than 4 times per year    Marital Status: Divorced  No Known Allergies  Outpatient Medications Prior to Visit  Medication Sig Dispense Refill   baclofen (LIORESAL) 10 MG tablet Take 10 mg by mouth 3 (three) times daily.     cyproheptadine (PERIACTIN) 4 MG tablet Take 1 tab PO TID PRN for appetite 30 tablet 0   gabapentin (NEURONTIN) 300 MG capsule Take 1 capsule (300 mg total) by mouth 3 (three) times daily as needed. 270 capsule 1   HYDROcodone-acetaminophen (NORCO/VICODIN) 5-325 MG tablet      meloxicam (MOBIC) 7.5 MG tablet Take 2 tablets (15 mg total) by mouth daily. 60 tablet 3   NARCAN 4 MG/0.1ML LIQD nasal spray kit      amLODipine (NORVASC) 2.5 MG tablet Take 1 tablet (2.5 mg total) by mouth daily. 90 tablet 1   DULoxetine (CYMBALTA) 60 MG capsule Take 1 capsule (60 mg total) by mouth  daily. For back pain 30 capsule 3   lisinopril-hydrochlorothiazide (ZESTORETIC) 20-12.5 MG tablet Take 2 tablets by mouth daily. 180 tablet 1   No facility-administered medications prior to visit.     ROS Review of Systems  Constitutional:  Negative for activity change and appetite change.  HENT:  Negative for sinus pressure and sore throat.   Respiratory:  Negative for chest tightness, shortness of breath and wheezing.   Cardiovascular:  Negative for chest pain and palpitations.  Gastrointestinal:  Negative for abdominal distention, abdominal pain and constipation.  Genitourinary: Negative.   Musculoskeletal:        See HPI  Psychiatric/Behavioral:  Negative for behavioral problems and dysphoric mood.     Objective:  BP (!) 159/83   Pulse 68   Ht '5\' 4"'  (1.626 m)   Wt 137 lb (62.1 kg)   SpO2 99%   BMI 23.52 kg/m      12/09/2021   10:19 AM 05/27/2021    8:42 AM 02/20/2021   10:13 AM  BP/Weight  Systolic BP 546 568 127  Diastolic BP 83 91 88  Wt. (Lbs) 137 135.8 145.2  BMI 23.52 kg/m2 21.92 kg/m2 23.44 kg/m2      Physical Exam Constitutional:      Appearance: He is well-developed.  Cardiovascular:     Rate and Rhythm: Normal rate.     Heart sounds: Normal heart sounds. No murmur heard. Pulmonary:     Effort: Pulmonary effort is normal.     Breath sounds: Normal breath sounds. No wheezing or rales.  Chest:     Chest wall: No tenderness.  Abdominal:     General: Bowel sounds are normal. There is no distension.     Palpations: Abdomen is soft. There is no mass.     Tenderness: There is no abdominal tenderness.  Musculoskeletal:     Right lower leg: No edema.     Left lower leg: No edema.     Comments: Tenderness on palpation of right hip, associated tenderness in right hip in all ranges of motion. No tenderness on palpation of the back Negative straight leg raise bilaterally  Neurological:     Mental Status: He is alert and oriented to person, place, and time.   Psychiatric:        Mood and Affect: Mood normal.        Latest Ref Rng & Units 05/27/2021    9:11 AM 12/30/2020    4:56 PM 07/22/2020    3:32 PM  CMP  Glucose 70 - 99 mg/dL 86  93  96   BUN 8 - 27 mg/dL 14  18  10   Creatinine 0.76 - 1.27 mg/dL 1.34  1.39  1.17   Sodium 134 - 144 mmol/L 143  138  136   Potassium 3.5 - 5.2 mmol/L 4.6  4.6  3.8   Chloride 96 - 106 mmol/L 103  104  101   CO2 20 - 29 mmol/L 23  26    Calcium 8.6 - 10.2 mg/dL 9.5  9.4  9.5   Total Protein 6.0 - 8.5 g/dL 7.4  7.2  7.6   Total Bilirubin 0.0 - 1.2 mg/dL 0.7  0.8  0.9   Alkaline Phos 44 - 121 IU/L 50  44  55   AST 0 - 40 IU/L '17  20  22   ' ALT 0 - 44 IU/L 11  16      Lipid Panel     Component Value Date/Time   CHOL 128 05/27/2021 0911   TRIG 97 05/27/2021 0911   HDL 39 (L) 05/27/2021 0911   CHOLHDL 3.1 07/22/2020 1532   CHOLHDL 5.0 Ratio 05/06/2010 2038   VLDL 37 05/06/2010 2038   LDLCALC 71 05/27/2021 0911    CBC    Component Value Date/Time   WBC 8.1 12/30/2020 1656   RBC 4.11 (L) 12/30/2020 1656   HGB 13.3 12/30/2020 1656   HGB 13.8 02/10/2019 1122   HCT 39.6 12/30/2020 1656   HCT 41.7 02/10/2019 1122   PLT 170 12/30/2020 1656   PLT 198 02/10/2019 1122   MCV 96.4 12/30/2020 1656   MCV 94 02/10/2019 1122   MCH 32.4 12/30/2020 1656   MCHC 33.6 12/30/2020 1656   RDW 14.6 12/30/2020 1656   RDW 13.0 02/10/2019 1122   LYMPHSABS 1.5 12/30/2020 1656   LYMPHSABS 2.8 02/10/2019 1122   MONOABS 0.7 12/30/2020 1656   EOSABS 0.3 12/30/2020 1656   EOSABS 0.5 (H) 02/10/2019 1122   BASOSABS 0.0 12/30/2020 1656   BASOSABS 0.0 02/10/2019 1122    No results found for: "HGBA1C"  Assessment & Plan:  1. Essential hypertension Uncontrolled due to running out of medications which I have refilled Counseled on blood pressure goal of less than 130/80, low-sodium, DASH diet, medication compliance, 150 minutes of moderate intensity exercise per week. Discussed medication compliance, adverse  effects.  - amLODipine (NORVASC) 2.5 MG tablet; Take 1 tablet (2.5 mg total) by mouth daily.  Dispense: 90 tablet; Refill: 1 - Basic Metabolic Panel - lisinopril-hydrochlorothiazide (ZESTORETIC) 20-12.5 MG tablet; Take 2 tablets by mouth daily.  Dispense: 180 tablet; Refill: 1  2. Chronic bilateral low back pain with right-sided sciatica Uncontrolled Currently on pain medications per pain management - DULoxetine (CYMBALTA) 60 MG capsule; Take 1 capsule (60 mg total) by mouth daily. For back pain  Dispense: 30 capsule; Refill: 3  3. History of prostate cancer - PSA, total and free  4. Screening for colon cancer Referred at last visit Provided with number to GI so he can schedule colonoscopy as they have been trying to reach him but have been unsuccessful  5. Right hip pain We will need to exclude osteoarthritis - XR HIPS BILAT W OR W/O PELVIS MIN 5 VIEWS  6. Need for shingles vaccine - Zoster Vaccine Adjuvanted [facility]) injection; Inject 0.5 mLs into the muscle once for 1 dose.  Dispense: 0.5 mL; Refill: 1    Meds ordered this encounter  Medications   amLODipine (NORVASC) 2.5 MG tablet    Sig: Take 1 tablet (2.5 mg total) by mouth daily.    Dispense:  90  tablet    Refill:  1   DULoxetine (CYMBALTA) 60 MG capsule    Sig: Take 1 capsule (60 mg total) by mouth daily. For back pain    Dispense:  30 capsule    Refill:  3   lisinopril-hydrochlorothiazide (ZESTORETIC) 20-12.5 MG tablet    Sig: Take 2 tablets by mouth daily.    Dispense:  180 tablet    Refill:  1   Zoster Vaccine Adjuvanted [facility]) injection    Sig: Inject 0.5 mLs into the muscle once for 1 dose.    Dispense:  0.5 mL    Refill:  1    Give 2nd dose in two months.    Follow-up: Return in about 6 months (around 06/11/2022) for Chronic medical conditions.       Charlott Rakes, MD, FAAFP. [facility] [place], [place]   12/09/2021, 12:00 PM

## 2021-12-09 NOTE — Progress Notes (Signed)
Right leg pain. Pt on medicare.

## 2021-12-10 LAB — BASIC METABOLIC PANEL
BUN/Creatinine Ratio: 12 (ref 10–24)
BUN: 18 mg/dL (ref 8–27)
CO2: 20 mmol/L (ref 20–29)
Calcium: 9.9 mg/dL (ref 8.6–10.2)
Chloride: 99 mmol/L (ref 96–106)
Creatinine, Ser: 1.49 mg/dL — ABNORMAL HIGH (ref 0.76–1.27)
Glucose: 83 mg/dL (ref 70–99)
Potassium: 5 mmol/L (ref 3.5–5.2)
Sodium: 138 mmol/L (ref 134–144)
eGFR: 50 mL/min/{1.73_m2} — ABNORMAL LOW (ref >59)

## 2021-12-10 LAB — PSA, TOTAL AND FREE
PSA, Free Pct: 15.4 %
PSA, Free: 0.63 ng/mL
Prostate Specific Ag, Serum: 4.1 ng/mL — ABNORMAL HIGH (ref 0.0–4.0)

## 2021-12-18 ENCOUNTER — Ambulatory Visit (AMBULATORY_SURGERY_CENTER): Payer: Medicare HMO | Admitting: *Deleted

## 2021-12-18 VITALS — Ht 64.0 in | Wt 130.0 lb

## 2021-12-18 DIAGNOSIS — Z1211 Encounter for screening for malignant neoplasm of colon: Secondary | ICD-10-CM

## 2021-12-18 NOTE — Progress Notes (Signed)
No egg or soy allergy known to patient  No issues known to pt with past sedation with any surgeries or procedures Patient denies ever being told they had issues or difficulty with intubation  No FH of Malignant Hyperthermia Pt is not on diet pills Pt is not on  home 02  Pt is not on blood thinners  Pt denies issues with constipation  No A fib or A flutter Have any cardiac testing pending--NO Pt instructed to use Single[employer] or GoodRx for a price reduction on prep    Sample sheet of over the counter items to purchase for prep given to pt.

## 2021-12-24 ENCOUNTER — Telehealth: Payer: Self-pay | Admitting: Internal Medicine

## 2021-12-24 ENCOUNTER — Encounter: Payer: Self-pay | Admitting: Internal Medicine

## 2021-12-24 NOTE — Telephone Encounter (Signed)
She stated that she received a prior auth email that was sent to them and she wasn't sure what prior auth was needed. So she called. Requesting a call back on [phone].Please call to advise.

## 2021-12-24 NOTE — Telephone Encounter (Signed)
[facility] returned your call regarding prior auth email that was sent to them. Requesting a call back on [phone].

## 2022-01-04 NOTE — Progress Notes (Unsigned)
[facility] History and Physical   Primary Care Physician:  Charlott Rakes, MD   Reason for Procedure:   CRCA screening  Plan:    colonoscopy     HPI: Timothy [name] is a 71 y.o. male here for screening exam, A 2005 colonoscopy was negative.   Past Medical History:  Diagnosis Date   Chronic kidney disease (CKD), stage II (mild)    Dehydration    Essential hypertension    Low back pain with radiation    Personal history of prostate cancer     Past Surgical History:  Procedure Laterality Date   COLONOSCOPY     PROSTATE SURGERY      Prior to Admission medications   Medication Sig Start Date End Date Taking? Authorizing Provider  amLODipine (NORVASC) 2.5 MG tablet Take 1 tablet (2.5 mg total) by mouth daily. 12/09/21   Charlott Rakes, MD  baclofen (LIORESAL) 10 MG tablet Take 10 mg by mouth 3 (three) times daily.    [provider]  cyproheptadine (PERIACTIN) 4 MG tablet Take 1 tab PO TID PRN for appetite 07/22/20   Mayers, Cari S, PA-C  DULoxetine (CYMBALTA) 60 MG capsule Take 1 capsule (60 mg total) by mouth daily. For back pain 12/09/21   Charlott Rakes, MD  gabapentin (NEURONTIN) 300 MG capsule Take 1 capsule (300 mg total) by mouth 3 (three) times daily as needed. 05/01/20   Charlott Rakes, MD  HYDROcodone-acetaminophen (NORCO/VICODIN) 5-325 MG tablet as needed. 06/16/18   [provider]  lisinopril-hydrochlorothiazide (ZESTORETIC) 20-12.5 MG tablet Take 2 tablets by mouth daily. 12/09/21   Charlott Rakes, MD  meloxicam (MOBIC) 7.5 MG tablet Take 2 tablets (15 mg total) by mouth daily. 12/29/18   Argentina Donovan, PA-C  NARCAN 4 MG/0.1ML LIQD nasal spray kit  06/16/18   [provider]    Current Outpatient Medications  Medication Sig Dispense Refill   amLODipine (NORVASC) 2.5 MG tablet Take 1 tablet (2.5 mg total) by mouth daily. 90 tablet 1   baclofen (LIORESAL) 10 MG tablet Take 10 mg by mouth 3 (three) times daily.      cyproheptadine (PERIACTIN) 4 MG tablet Take 1 tab PO TID PRN for appetite 30 tablet 0   DULoxetine (CYMBALTA) 60 MG capsule Take 1 capsule (60 mg total) by mouth daily. For back pain 30 capsule 3   gabapentin (NEURONTIN) 300 MG capsule Take 1 capsule (300 mg total) by mouth 3 (three) times daily as needed. 270 capsule 1   HYDROcodone-acetaminophen (NORCO/VICODIN) 5-325 MG tablet as needed.     lisinopril-hydrochlorothiazide (ZESTORETIC) 20-12.5 MG tablet Take 2 tablets by mouth daily. 180 tablet 1   meloxicam (MOBIC) 7.5 MG tablet Take 2 tablets (15 mg total) by mouth daily. 60 tablet 3   NARCAN 4 MG/0.1ML LIQD nasal spray kit  (Patient not taking: Reported on 12/18/2021)     No current facility-administered medications for this visit.    Allergies as of 01/05/2022   (No Known Allergies)    Family History  Problem Relation Age of Onset   Hypertension Mother    Cancer Neg Hx    Diabetes Neg Hx    CVA Neg Hx    Colon cancer Neg Hx    Colon polyps Neg Hx    Crohn's disease Neg Hx    Rectal cancer Neg Hx    Stomach cancer Neg Hx    Ulcerative colitis Neg Hx     Social History   Socioeconomic History  Marital status: Single    Spouse name: Not on file   Number of children: Not on file   Years of education: Not on file   Highest education level: Not on file  Occupational History   Not on file  Tobacco Use   Smoking status: Some Days    Types: Cigars    Passive exposure: Never   Smokeless tobacco: Never  Vaping Use   Vaping Use: Never used  Substance and Sexual Activity   Alcohol use: Not Currently   Drug use: Yes    Types: Marijuana    Comment: LAST SMOKED 12/13/21,UPDATED 12/18/21   Sexual activity: Not Currently  Other Topics Concern   Not on file  Social History Narrative   Not on file   Social Determinants of Health   Financial Resource Strain: Low Risk  (05/13/2021)   Overall Financial Resource Strain (CARDIA)    Difficulty of Paying Living Expenses: Not very  hard  Food Insecurity: No Food Insecurity (05/13/2021)   Hunger Vital Sign    Worried About Running Out of Food in the Last Year: Never true    Ran Out of Food in the Last Year: Never true  Transportation Needs: No Transportation Needs (05/13/2021)   PRAPARE - Hydrologist (Medical): No    Lack of Transportation (Non-Medical): No  Physical Activity: Sufficiently Active (05/13/2021)   Exercise Vital Sign    Days of Exercise per Week: 7 days    Minutes of Exercise per Session: 30 min  Stress: No Stress Concern Present (05/13/2021)   [place]    Feeling of Stress : Not at all  Social Connections: Moderately Integrated (05/13/2021)   Social Connection and Isolation Panel [NHANES]    Frequency of Communication with Friends and Family: More than three times a week    Frequency of Social Gatherings with Friends and Family: More than three times a week    Attends Religious Services: More than 4 times per year    Active Member of [employer] or Organizations: Yes    Attends Music therapist: More than 4 times per year    Marital Status: Divorced  Intimate Partner Violence: Not At Risk (05/13/2021)   Humiliation, Afraid, Rape, and Kick questionnaire    Fear of Current or Ex-Partner: No    Emotionally Abused: No    Physically Abused: No    Sexually Abused: No    Review of Systems: Positive for *** All other review of systems negative except as mentioned in the HPI.  Physical Exam: Vital signs There were no vitals taken for this visit.  General:   Alert,  Well-developed, well-nourished, pleasant and cooperative in NAD Lungs:  Clear throughout to auscultation.   Heart:  Regular rate and rhythm; no murmurs, clicks, rubs,  or gallops. Abdomen:  Soft, nontender and nondistended. Normal bowel sounds.   Neuro/Psych:  Alert and cooperative. Normal mood and affect. A and O x 3   _0  E.  Carlean Purl, MD, [facility] [phone] (pager) 01/04/2022 9:48 PM@

## 2022-01-05 ENCOUNTER — Ambulatory Visit (AMBULATORY_SURGERY_CENTER): Payer: Medicare HMO | Admitting: Internal Medicine

## 2022-01-05 ENCOUNTER — Encounter: Payer: Self-pay | Admitting: Internal Medicine

## 2022-01-05 VITALS — BP 110/80 | HR 68 | Temp 98.6°F | Resp 20 | Ht 64.0 in | Wt 130.0 lb

## 2022-01-05 DIAGNOSIS — Z1211 Encounter for screening for malignant neoplasm of colon: Secondary | ICD-10-CM

## 2022-01-05 DIAGNOSIS — I10 Essential (primary) hypertension: Secondary | ICD-10-CM | POA: Diagnosis not present

## 2022-01-05 DIAGNOSIS — N182 Chronic kidney disease, stage 2 (mild): Secondary | ICD-10-CM | POA: Diagnosis not present

## 2022-01-05 MED ORDER — SODIUM CHLORIDE 0.9 % IV SOLN: 500.0000 mL | Freq: Once | INTRAVENOUS | Status: DC

## 2022-01-05 NOTE — Op Note (Signed)
[facility] Patient Name: Timothy [name] Procedure Date: 01/05/2022 4:08 PM MRN: [id number] Endoscopist: Gatha Mayer , MD Age: 71 Referring MD:  Date of Birth: March 23, 1951 Gender: Male Account #: [id number] Procedure:                Colonoscopy Indications:              Screening for colorectal malignant neoplasm, Last                            colonoscopy: 2005 Medicines:                Monitored Anesthesia Care Procedure:                Pre-Anesthesia Assessment:                           - Prior to the procedure, a History and Physical                            was performed, and patient medications and                            allergies were reviewed. The patient's tolerance of                            previous anesthesia was also reviewed. The risks                            and benefits of the procedure and the sedation                            options and risks were discussed with the patient.                            All questions were answered, and informed consent                            was obtained. Prior Anticoagulants: The patient has                            taken no previous anticoagulant or antiplatelet                            agents. ASA Grade Assessment: II - A patient with                            mild systemic disease. After reviewing the risks                            and benefits, the patient was deemed in                            satisfactory condition to undergo the procedure.  After obtaining informed consent, the colonoscope                            was passed under direct vision. Throughout the                            procedure, the patient's blood pressure, pulse, and                            oxygen saturations were monitored continuously. The                            CF [id number] #[id number] was introduced through the anus                            and advanced to the the cecum, identified  by                            appendiceal orifice and ileocecal valve. The                            colonoscopy was performed without difficulty. The                            patient tolerated the procedure well. The quality                            of the bowel preparation was good. The ileocecal                            valve, appendiceal orifice, and rectum were                            photographed. The bowel preparation used was                            Miralax via split dose instruction. Scope In: 4:13:54 PM Scope Out: 4:30:53 PM Scope Withdrawal Time: 0 hours 12 minutes 18 seconds  Total Procedure Duration: 0 hours 16 minutes 59 seconds  Findings:                 The perianal and digital rectal examinations were                            normal.                           The entire examined colon appeared normal on direct                            and retroflexion views. Complications:            No immediate complications. Estimated Blood Loss:     Estimated blood loss: none. Impression:               - The entire examined  colon is normal on direct and                            retroflexion views.                           - No specimens collected. Recommendation:           - Patient has a contact number available for                            emergencies. The signs and symptoms of potential                            delayed complications were discussed with the                            patient. Return to normal activities tomorrow.                            Written discharge instructions were provided to the                            patient.                           - Resume previous diet.                           - Continue present medications.                           - No repeat colonoscopy due to current age (51                            years or older) and the absence of colonic polyps. Gatha Mayer, MD 01/05/2022 4:40:07 PM This report has  been signed electronically.

## 2022-01-05 NOTE — Progress Notes (Signed)
Report given to PACU, vss 

## 2022-01-05 NOTE — Progress Notes (Signed)
Pt. Reports no change since his medical or surgical history since his pre-visit 12/18/2021.

## 2022-01-05 NOTE — Patient Instructions (Addendum)
No polyps or cancer were seen.  No need for routine repeat colonoscopy since no polyps and next time will be 80.  If you have problems (hope not) we would investigate.  I appreciate the opportunity to care for you. Gatha Mayer, MD, [facility]  Please read handouts provided. Continue present medications. Resume previous diet.   YOU HAD AN ENDOSCOPIC PROCEDURE TODAY AT [facility]:   Refer to the procedure report that was given to you for any specific questions about what was found during the examination.  If the procedure report does not answer your questions, please call your gastroenterologist to clarify.  If you requested that your care partner not be given the details of your procedure findings, then the procedure report has been included in a sealed envelope for you to review at your convenience later.  YOU SHOULD EXPECT: Some feelings of bloating in the abdomen. Passage of more gas than usual.  Walking can help get rid of the air that was put into your GI tract during the procedure and reduce the bloating. If you had a lower endoscopy (such as a colonoscopy or flexible sigmoidoscopy) you may notice spotting of blood in your stool or on the toilet paper. If you underwent a bowel prep for your procedure, you may not have a normal bowel movement for a few days.  Please Note:  You might notice some irritation and congestion in your nose or some drainage.  This is from the oxygen used during your procedure.  There is no need for concern and it should clear up in a day or so.  SYMPTOMS TO REPORT IMMEDIATELY:  Following lower endoscopy (colonoscopy or flexible sigmoidoscopy):  Excessive amounts of blood in the stool  Significant tenderness or worsening of abdominal pains  Swelling of the abdomen that is new, acute  Fever of 100F or higher.  For urgent or emergent issues, a gastroenterologist can be reached at any hour by calling [phone]. Do not use MyChart messaging  for urgent concerns.    DIET:  We do recommend a small meal at first, but then you may proceed to your regular diet.  Drink plenty of fluids but you should avoid alcoholic beverages for 24 hours.  ACTIVITY:  You should plan to take it easy for the rest of today and you should NOT DRIVE or use heavy machinery until tomorrow (because of the sedation medicines used during the test).    FOLLOW UP: Our staff will call the number listed on your records the next business day following your procedure.  We will call around 7:15- 8:00 am to check on you and address any questions or concerns that you may have regarding the information given to you following your procedure. If we do not reach you, we will leave a message.     If any biopsies were taken you will be contacted by phone or by letter within the next 1-3 weeks.  Please call us at [phone] if you have not heard about the biopsies in 3 weeks.    SIGNATURES/CONFIDENTIALITY: You and/or your care partner have signed paperwork which will be entered into your electronic medical record.  These signatures attest to the fact that that the information above on your After Visit Summary has been reviewed and is understood.  Full responsibility of the confidentiality of this discharge information lies with you and/or your care-partner.

## 2022-01-06 ENCOUNTER — Telehealth: Payer: Self-pay

## 2022-01-06 NOTE — Telephone Encounter (Signed)
  Follow up Call-     01/05/2022    3:21 PM  Call back number  Post procedure Call Back phone  # [phone]  Permission to leave phone message Yes     Patient questions:  Do you have a fever, pain , or abdominal swelling? No. Pain Score  0 *  Have you tolerated food without any problems? Yes.    Have you been able to return to your normal activities? Yes.    Do you have any questions about your discharge instructions: Diet   No. Medications  No. Follow up visit  No.  Do you have questions or concerns about your Care? No.  Actions: * If pain score is 4 or above: No action needed, pain <4.

## 2022-01-15 ENCOUNTER — Encounter: Payer: Medicare HMO | Admitting: Internal Medicine

## 2022-01-19 DIAGNOSIS — Z79899 Other long term (current) drug therapy: Secondary | ICD-10-CM | POA: Diagnosis not present

## 2022-01-19 DIAGNOSIS — M5136 Other intervertebral disc degeneration, lumbar region: Secondary | ICD-10-CM | POA: Diagnosis not present

## 2022-01-19 DIAGNOSIS — Z681 Body mass index (BMI) 19 or less, adult: Secondary | ICD-10-CM | POA: Diagnosis not present

## 2022-01-19 DIAGNOSIS — G894 Chronic pain syndrome: Secondary | ICD-10-CM | POA: Diagnosis not present

## 2022-01-19 DIAGNOSIS — R030 Elevated blood-pressure reading, without diagnosis of hypertension: Secondary | ICD-10-CM | POA: Diagnosis not present

## 2022-02-24 DIAGNOSIS — G894 Chronic pain syndrome: Secondary | ICD-10-CM | POA: Diagnosis not present

## 2022-02-24 DIAGNOSIS — R030 Elevated blood-pressure reading, without diagnosis of hypertension: Secondary | ICD-10-CM | POA: Diagnosis not present

## 2022-02-24 DIAGNOSIS — Z79899 Other long term (current) drug therapy: Secondary | ICD-10-CM | POA: Diagnosis not present

## 2022-02-24 DIAGNOSIS — M5136 Other intervertebral disc degeneration, lumbar region: Secondary | ICD-10-CM | POA: Diagnosis not present

## 2022-02-24 DIAGNOSIS — Z681 Body mass index (BMI) 19 or less, adult: Secondary | ICD-10-CM | POA: Diagnosis not present

## 2022-03-02 DIAGNOSIS — Z79899 Other long term (current) drug therapy: Secondary | ICD-10-CM | POA: Diagnosis not present

## 2022-03-26 DIAGNOSIS — R030 Elevated blood-pressure reading, without diagnosis of hypertension: Secondary | ICD-10-CM | POA: Diagnosis not present

## 2022-03-26 DIAGNOSIS — Z79899 Other long term (current) drug therapy: Secondary | ICD-10-CM | POA: Diagnosis not present

## 2022-03-26 DIAGNOSIS — M5136 Other intervertebral disc degeneration, lumbar region: Secondary | ICD-10-CM | POA: Diagnosis not present

## 2022-03-26 DIAGNOSIS — G894 Chronic pain syndrome: Secondary | ICD-10-CM | POA: Diagnosis not present

## 2022-03-26 DIAGNOSIS — Z6820 Body mass index (BMI) 20.0-20.9, adult: Secondary | ICD-10-CM | POA: Diagnosis not present

## 2022-03-31 DIAGNOSIS — Z79899 Other long term (current) drug therapy: Secondary | ICD-10-CM | POA: Diagnosis not present

## 2022-04-21 ENCOUNTER — Encounter (HOSPITAL_BASED_OUTPATIENT_CLINIC_OR_DEPARTMENT_OTHER): Payer: Self-pay

## 2022-04-21 ENCOUNTER — Emergency Department (HOSPITAL_BASED_OUTPATIENT_CLINIC_OR_DEPARTMENT_OTHER)
Admission: EM | Admit: 2022-04-21 | Discharge: 2022-04-21 | Disposition: A | Discharge status: Home / Self Care | Payer: Medicare HMO | Attending: Emergency Medicine | Admitting: Emergency Medicine

## 2022-04-21 ENCOUNTER — Other Ambulatory Visit: Payer: Self-pay

## 2022-04-21 DIAGNOSIS — Z0000 Encounter for general adult medical examination without abnormal findings: Secondary | ICD-10-CM | POA: Diagnosis not present

## 2022-04-21 DIAGNOSIS — M25551 Pain in right hip: Secondary | ICD-10-CM | POA: Diagnosis not present

## 2022-04-21 DIAGNOSIS — Z8546 Personal history of malignant neoplasm of prostate: Secondary | ICD-10-CM | POA: Diagnosis not present

## 2022-04-21 DIAGNOSIS — Z79899 Other long term (current) drug therapy: Secondary | ICD-10-CM | POA: Diagnosis not present

## 2022-04-21 DIAGNOSIS — I10 Essential (primary) hypertension: Secondary | ICD-10-CM | POA: Diagnosis not present

## 2022-04-21 DIAGNOSIS — R7989 Other specified abnormal findings of blood chemistry: Secondary | ICD-10-CM | POA: Diagnosis not present

## 2022-04-21 DIAGNOSIS — R000 Tachycardia, unspecified: Secondary | ICD-10-CM | POA: Diagnosis not present

## 2022-04-21 DIAGNOSIS — G894 Chronic pain syndrome: Secondary | ICD-10-CM | POA: Diagnosis not present

## 2022-04-21 DIAGNOSIS — Z6821 Body mass index (BMI) 21.0-21.9, adult: Secondary | ICD-10-CM | POA: Diagnosis not present

## 2022-04-21 DIAGNOSIS — M5136 Other intervertebral disc degeneration, lumbar region: Secondary | ICD-10-CM | POA: Diagnosis not present

## 2022-04-21 DIAGNOSIS — R030 Elevated blood-pressure reading, without diagnosis of hypertension: Secondary | ICD-10-CM | POA: Diagnosis not present

## 2022-04-21 DIAGNOSIS — Z139 Encounter for screening, unspecified: Secondary | ICD-10-CM

## 2022-04-21 LAB — CBC
HCT: 46.5 % (ref 39.0–52.0)
Hemoglobin: 15.4 g/dL (ref 13.0–17.0)
MCH: 32.4 pg (ref 26.0–34.0)
MCHC: 33.1 g/dL (ref 30.0–36.0)
MCV: 97.9 fL (ref 80.0–100.0)
Platelets: 220 10*3/uL (ref 150–400)
RBC: 4.75 MIL/uL (ref 4.22–5.81)
RDW: 15.5 % (ref 11.5–15.5)
WBC: 7.7 10*3/uL (ref 4.0–10.5)
nRBC: 0 % (ref 0.0–0.2)

## 2022-04-21 LAB — BASIC METABOLIC PANEL
Anion gap: 8 (ref 5–15)
BUN: 19 mg/dL (ref 8–23)
CO2: 28 mmol/L (ref 22–32)
Calcium: 10.4 mg/dL — ABNORMAL HIGH (ref 8.9–10.3)
Chloride: 102 mmol/L (ref 98–111)
Creatinine, Ser: 1.64 mg/dL — ABNORMAL HIGH (ref 0.61–1.24)
GFR, Estimated: 45 mL/min — ABNORMAL LOW (ref >60)
Glucose, Bld: 95 mg/dL (ref 70–99)
Potassium: 4.6 mmol/L (ref 3.5–5.1)
Sodium: 138 mmol/L (ref 135–145)

## 2022-04-21 NOTE — Discharge Instructions (Signed)
Please follow-up with your primary care doctor as needed.  You are found to have a normal heart rate today, your elevated heart rate at your doctor's office may have been secondary to your pain.  If you have chest pain, shortness of breath, dizziness please return to the ER.

## 2022-04-21 NOTE — ED Provider Notes (Signed)
[facility] EMERGENCY DEPT Provider Note   CSN: [id number] Arrival date & time: 04/21/22  1041     History  Chief Complaint  Patient presents with   Tachycardia    Timothy [name] is a 72 y.o. male, history of hypertension, prostate cancer post prostatectomy, who presents to the ED secondary to higher heart rate, at his doctor's office.  He said he was at his doctor's office for shot in his right hip, for pain, and he was told his heart rate was high and then he needed to come to the ER.  He denies any chest pain, shortness of breath, dizziness.  He denies any swelling of his limbs, recent trauma, procedures.  Has no complaints and is just here because his doctor told him to come here.      Home Medications Prior to Admission medications   Medication Sig Start Date End Date Taking? Authorizing Provider  amLODipine (NORVASC) 2.5 MG tablet Take 1 tablet (2.5 mg total) by mouth daily. 12/09/21   Charlott Rakes, MD  baclofen (LIORESAL) 10 MG tablet Take 10 mg by mouth 3 (three) times daily.    [provider]  cyproheptadine (PERIACTIN) 4 MG tablet Take 1 tab PO TID PRN for appetite 07/22/20   Mayers, Cari S, PA-C  DULoxetine (CYMBALTA) 60 MG capsule Take 1 capsule (60 mg total) by mouth daily. For back pain 12/09/21   Charlott Rakes, MD  gabapentin (NEURONTIN) 300 MG capsule Take 1 capsule (300 mg total) by mouth 3 (three) times daily as needed. 05/01/20   Charlott Rakes, MD  HYDROcodone-acetaminophen (NORCO/VICODIN) 5-325 MG tablet as needed. 06/16/18   [provider]  lisinopril-hydrochlorothiazide (ZESTORETIC) 20-12.5 MG tablet Take 2 tablets by mouth daily. 12/09/21   Charlott Rakes, MD  meloxicam (MOBIC) 7.5 MG tablet Take 2 tablets (15 mg total) by mouth daily. 12/29/18   Argentina Donovan, PA-C      Allergies    Patient has no known allergies.    Review of Systems   Review of Systems  Respiratory:  Negative for shortness of breath.    Cardiovascular:  Negative for chest pain and leg swelling.    Physical Exam Updated Vital Signs BP (!) 137/103   Pulse 87   Temp 98.6 F (37 C)   Resp 20   Ht '5\' 4"'$  (1.626 m)   Wt 59 kg   SpO2 96%   BMI 22.33 kg/m  Physical Exam Vitals and nursing note reviewed.  Constitutional:      General: He is not in acute distress.    Appearance: He is well-developed.  HENT:     Head: Normocephalic and atraumatic.  Eyes:     Conjunctiva/sclera: Conjunctivae normal.  Cardiovascular:     Rate and Rhythm: Normal rate and regular rhythm.     Heart sounds: No murmur heard. Pulmonary:     Effort: Pulmonary effort is normal. No respiratory distress.     Breath sounds: Normal breath sounds.  Abdominal:     Palpations: Abdomen is soft.     Tenderness: There is no abdominal tenderness.  Musculoskeletal:        General: No swelling.     Cervical back: Neck supple.  Skin:    General: Skin is warm and dry.     Capillary Refill: Capillary refill takes less than 2 seconds.  Neurological:     Mental Status: He is alert.  Psychiatric:        Mood and Affect: Mood normal.  ED Results / Procedures / Treatments   Labs (all labs ordered are listed, but only abnormal results are displayed) Labs Reviewed  BASIC METABOLIC PANEL - Abnormal; Notable for the following components:      Result Value   Creatinine, Ser 1.64 (*)    Calcium 10.4 (*)    GFR, Estimated 45 (*)    All other components within normal limits  CBC    EKG EKG Interpretation  Date/Time:  Tuesday April 21 2022 11:25:45 EST Ventricular Rate:  108 PR Interval:  136 QRS Duration: 92 QT Interval:  350 QTC Calculation: 469 R Axis:   -70 Text Interpretation: Sinus tachycardia Biatrial enlargement Pulmonary disease pattern Incomplete right bundle branch block Left anterior fascicular block Left ventricular hypertrophy ( R in aVL , Cornell product , Romhilt-Estes ) Cannot rule out Septal infarct , age undetermined When  compared with ECG of 31-Dec-2020 09:44, PREVIOUS ECG IS PRESENT Confirmed by Blanchie Dessert [phone]) on 04/21/2022 1:12:24 PM  Radiology No results found.  Procedures Procedures    Medications Ordered in ED Medications - No data to display  ED Course/ Medical Decision Making/ A&P                           Medical Decision Making Patient is a 72 year old male, history of hypertension, prostate cancer, who presents to the ED secondary to being sent in by his MD secondary to tachycardia.  On exam he is nontachycardic, when he first arrived to the ER he was 106 bpm.  He denies any chest pain, shortness of breath.  Triage ordered labs, and they are within normal limits.  I discussed with Dr. Maryan Rued, and she evaluated him, and given that he is asymptomatic, and nontachycardic at this time, he can be discharged.  Return precautions were emphasized.  Amount and/or Complexity of Data Reviewed Labs: ordered.   Final Clinical Impression(s) / ED Diagnoses Final diagnoses:  Encounter for medical screening examination    Rx / DC Orders ED Discharge Orders     None         Cambrie Sonnenfeld, Si Gaul, PA 04/21/22 1429    Blanchie Dessert, MD 04/26/22 1658

## 2022-04-21 NOTE — ED Triage Notes (Signed)
Patient here POV from MD Office.  States he was at MD to have Pain in Hips assessed. At the Office it was noted that he had a "high heart rate" (unsure of rate at office). Sent for Evaluation. No SOB. No CP.  NAD Noted during Triage. A&Ox4. GCS 15. Ambulatory.

## 2022-04-27 DIAGNOSIS — Z79899 Other long term (current) drug therapy: Secondary | ICD-10-CM | POA: Diagnosis not present

## 2022-05-22 DIAGNOSIS — Z79899 Other long term (current) drug therapy: Secondary | ICD-10-CM | POA: Diagnosis not present

## 2022-05-22 DIAGNOSIS — Z6822 Body mass index (BMI) 22.0-22.9, adult: Secondary | ICD-10-CM | POA: Diagnosis not present

## 2022-05-22 DIAGNOSIS — R030 Elevated blood-pressure reading, without diagnosis of hypertension: Secondary | ICD-10-CM | POA: Diagnosis not present

## 2022-05-22 DIAGNOSIS — G894 Chronic pain syndrome: Secondary | ICD-10-CM | POA: Diagnosis not present

## 2022-05-22 DIAGNOSIS — M5136 Other intervertebral disc degeneration, lumbar region: Secondary | ICD-10-CM | POA: Diagnosis not present

## 2022-05-22 DIAGNOSIS — R7989 Other specified abnormal findings of blood chemistry: Secondary | ICD-10-CM | POA: Diagnosis not present

## 2022-05-26 DIAGNOSIS — Z79899 Other long term (current) drug therapy: Secondary | ICD-10-CM | POA: Diagnosis not present

## 2022-06-11 ENCOUNTER — Encounter: Payer: Self-pay | Admitting: Family Medicine

## 2022-06-11 ENCOUNTER — Ambulatory Visit: Payer: Medicare HMO | Attending: Family Medicine | Admitting: Family Medicine

## 2022-06-11 VITALS — BP 162/91 | HR 70 | Temp 98.0°F | Ht 64.0 in | Wt 140.4 lb

## 2022-06-11 DIAGNOSIS — Z8546 Personal history of malignant neoplasm of prostate: Secondary | ICD-10-CM

## 2022-06-11 DIAGNOSIS — F17210 Nicotine dependence, cigarettes, uncomplicated: Secondary | ICD-10-CM

## 2022-06-11 DIAGNOSIS — G8929 Other chronic pain: Secondary | ICD-10-CM

## 2022-06-11 DIAGNOSIS — M25559 Pain in unspecified hip: Secondary | ICD-10-CM

## 2022-06-11 DIAGNOSIS — I10 Essential (primary) hypertension: Secondary | ICD-10-CM | POA: Diagnosis not present

## 2022-06-11 DIAGNOSIS — M5441 Lumbago with sciatica, right side: Secondary | ICD-10-CM | POA: Diagnosis not present

## 2022-06-11 DIAGNOSIS — Z23 Encounter for immunization: Secondary | ICD-10-CM | POA: Diagnosis not present

## 2022-06-11 MED ORDER — DULOXETINE HCL 60 MG PO CPEP: 60.0000 mg | ORAL_CAPSULE | Freq: Every day | ORAL | 1 refills | Status: DC

## 2022-06-11 MED ORDER — AMLODIPINE BESYLATE 5 MG PO TABS: 5.0000 mg | ORAL_TABLET | Freq: Every day | ORAL | 1 refills | Status: DC

## 2022-06-11 MED ORDER — LISINOPRIL-HYDROCHLOROTHIAZIDE 20-12.5 MG PO TABS: 2.0000 | ORAL_TABLET | Freq: Every day | ORAL | 1 refills | Status: DC

## 2022-06-11 NOTE — Progress Notes (Signed)
Subjective:  Patient ID: Timothy [name], male    DOB: 1950-08-22  Age: 72 y.o. MRN: [id number]  CC: Hypertension   HPI Timothy [name] is a 72 y.o. year old male with a history of Hypertension, chronic low back pain, history of Prostate ca, smokes cigars (since he was 17)  Interval History:  His hip continues to hurt right >left  despite his adhering to his narcotic analgesic which he receives from the Pain Clinic. On further review he has been out of Percocet but has been taking Baclofen and Gabapentin.  He also has low back pain.  He informs me he has had imaging done but I only see a back x-ray from 2020 which reveals mild degenerative change without acute abnormality.  He ran out of his antihypertensive hence his elevated blood pressure.  On questioning about his prostate follow-up he informs me he is under the care of urology but does not know the name of his urologist.  He has no urinary symptoms.   Past Medical History:  Diagnosis Date   Chronic kidney disease (CKD), stage II (mild)    Dehydration    Essential hypertension    Low back pain with radiation    Personal history of prostate cancer     Past Surgical History:  Procedure Laterality Date   COLONOSCOPY     PROSTATE SURGERY      Family History  Problem Relation Age of Onset   Hypertension Mother    Cancer Neg Hx    Diabetes Neg Hx    CVA Neg Hx    Colon cancer Neg Hx    Colon polyps Neg Hx    Crohn's disease Neg Hx    Rectal cancer Neg Hx    Stomach cancer Neg Hx    Ulcerative colitis Neg Hx     Social History   Socioeconomic History   Marital status: Single    Spouse name: Not on file   Number of children: Not on file   Years of education: Not on file   Highest education level: Not on file  Occupational History   Not on file  Tobacco Use   Smoking status: Some Days    Types: Cigars    Passive exposure: Never   Smokeless tobacco: Never  Vaping Use   Vaping Use: Never used  Substance and  Sexual Activity   Alcohol use: Not Currently   Drug use: Yes    Types: Marijuana    Comment: LAST SMOKED 12/13/21,UPDATED 12/18/21   Sexual activity: Not Currently  Other Topics Concern   Not on file  Social History Narrative   Not on file   Social Determinants of Health   Financial Resource Strain: Low Risk  (05/13/2021)   Overall Financial Resource Strain (CARDIA)    Difficulty of Paying Living Expenses: Not very hard  Food Insecurity: No Food Insecurity (05/13/2021)   Hunger Vital Sign    Worried About Running Out of Food in the Last Year: Never true    Ran Out of Food in the Last Year: Never true  Transportation Needs: No Transportation Needs (05/13/2021)   PRAPARE - Hydrologist (Medical): No    Lack of Transportation (Non-Medical): No  Physical Activity: Sufficiently Active (05/13/2021)   Exercise Vital Sign    Days of Exercise per Week: 7 days    Minutes of Exercise per Session: 30 min  Stress: No Stress Concern Present (05/13/2021)   [employer] of  Occupational Health - Occupational Stress Questionnaire    Feeling of Stress : Not at all  Social Connections: Moderately Integrated (05/13/2021)   Social Connection and Isolation Panel [NHANES]    Frequency of Communication with Friends and Family: More than three times a week    Frequency of Social Gatherings with Friends and Family: More than three times a week    Attends Religious Services: More than 4 times per year    Active Member of [employer] or Organizations: Yes    Attends Music therapist: More than 4 times per year    Marital Status: Divorced    No Known Allergies  Outpatient Medications Prior to Visit  Medication Sig Dispense Refill   baclofen (LIORESAL) 10 MG tablet Take 10 mg by mouth 3 (three) times daily.     cyproheptadine (PERIACTIN) 4 MG tablet Take 1 tab PO TID PRN for appetite 30 tablet 0   gabapentin (NEURONTIN) 300 MG capsule Take 1 capsule (300 mg total) by  mouth 3 (three) times daily as needed. 270 capsule 1   amLODipine (NORVASC) 2.5 MG tablet Take 1 tablet (2.5 mg total) by mouth daily. 90 tablet 1   DULoxetine (CYMBALTA) 60 MG capsule Take 1 capsule (60 mg total) by mouth daily. For back pain 30 capsule 3   lisinopril-hydrochlorothiazide (ZESTORETIC) 20-12.5 MG tablet Take 2 tablets by mouth daily. 180 tablet 1   HYDROcodone-acetaminophen (NORCO/VICODIN) 5-325 MG tablet as needed. (Patient not taking: Reported on 06/11/2022)     meloxicam (MOBIC) 7.5 MG tablet Take 2 tablets (15 mg total) by mouth daily. (Patient not taking: Reported on 06/11/2022) 60 tablet 3   No facility-administered medications prior to visit.     ROS Review of Systems  Constitutional:  Negative for activity change and appetite change.  HENT:  Negative for sinus pressure and sore throat.   Respiratory:  Negative for chest tightness, shortness of breath and wheezing.   Cardiovascular:  Negative for chest pain and palpitations.  Gastrointestinal:  Negative for abdominal distention, abdominal pain and constipation.  Genitourinary: Negative.   Musculoskeletal:        See HPI  Psychiatric/Behavioral:  Negative for behavioral problems and dysphoric mood.     Objective:  BP (!) 162/91   Pulse 70   Temp 98 F (36.7 C) (Oral)   Ht 5' 4"$  (1.626 m)   Wt 140 lb 6.4 oz (63.7 kg)   SpO2 100%   BMI 24.10 kg/m      06/11/2022    8:44 AM 04/21/2022    2:30 PM 04/21/2022    2:15 PM  BP/Weight  Systolic BP 0000000 0000000 0000000  Diastolic BP 91 [id number]  Wt. (Lbs) 140.4    BMI 24.1 kg/m2        Physical Exam Constitutional:      Appearance: He is well-developed.  Cardiovascular:     Rate and Rhythm: Normal rate.     Heart sounds: Normal heart sounds. No murmur heard. Pulmonary:     Effort: Pulmonary effort is normal.     Breath sounds: Normal breath sounds. No wheezing or rales.  Chest:     Chest wall: No tenderness.  Abdominal:     General: Bowel sounds are normal.  There is no distension.     Palpations: Abdomen is soft. There is no mass.     Tenderness: There is no abdominal tenderness.  Musculoskeletal:     Right lower leg: No edema.     Left lower  leg: No edema.     Comments: Tenderness in right hip on range of motion Tenderness on palpation of lumbar spine  Neurological:     Mental Status: He is alert and oriented to person, place, and time.  Psychiatric:        Mood and Affect: Mood normal.        Latest Ref Rng & Units 04/21/2022   11:35 AM 12/09/2021   11:30 AM 05/27/2021    9:11 AM  CMP  Glucose 70 - 99 mg/dL 95  83  86   BUN 8 - 23 mg/dL 19  18  14   $ Creatinine 0.61 - 1.24 mg/dL 1.64  1.49  1.34   Sodium 135 - 145 mmol/L 138  138  143   Potassium 3.5 - 5.1 mmol/L 4.6  5.0  4.6   Chloride 98 - 111 mmol/L 102  99  103   CO2 22 - 32 mmol/L 28  20  23   $ Calcium 8.9 - 10.3 mg/dL 10.4  9.9  9.5   Total Protein 6.0 - 8.5 g/dL   7.4   Total Bilirubin 0.0 - 1.2 mg/dL   0.7   Alkaline Phos 44 - 121 IU/L   50   AST 0 - 40 IU/L   17   ALT 0 - 44 IU/L   11     Lipid Panel     Component Value Date/Time   CHOL 128 05/27/2021 0911   TRIG 97 05/27/2021 0911   HDL 39 (L) 05/27/2021 0911   CHOLHDL 3.1 07/22/2020 1532   CHOLHDL 5.0 Ratio 05/06/2010 2038   VLDL 37 05/06/2010 2038   LDLCALC 71 05/27/2021 0911    CBC    Component Value Date/Time   WBC 7.7 04/21/2022 1135   RBC 4.75 04/21/2022 1135   HGB 15.4 04/21/2022 1135   HGB 13.8 02/10/2019 1122   HCT 46.5 04/21/2022 1135   HCT 41.7 02/10/2019 1122   PLT 220 04/21/2022 1135   PLT 198 02/10/2019 1122   MCV 97.9 04/21/2022 1135   MCV 94 02/10/2019 1122   MCH 32.4 04/21/2022 1135   MCHC 33.1 04/21/2022 1135   RDW 15.5 04/21/2022 1135   RDW 13.0 02/10/2019 1122   LYMPHSABS 1.5 12/30/2020 1656   LYMPHSABS 2.8 02/10/2019 1122   MONOABS 0.7 12/30/2020 1656   EOSABS 0.3 12/30/2020 1656   EOSABS 0.5 (H) 02/10/2019 1122   BASOSABS 0.0 12/30/2020 1656   BASOSABS 0.0 02/10/2019 1122     No results found for: "HGBA1C"  Assessment & Plan:  1. Essential hypertension Uncontrolled due to running out of antihypertensives which I have refilled Of note at the previous visit his blood pressure was elevated even though at that time he had taken his antihypertensive I have raised the dose of amlodipine from 2.5 mg to 5 mg and BP will be reassessed at next visit. - amLODipine (NORVASC) 5 MG tablet; Take 1 tablet (5 mg total) by mouth daily.  Dispense: 90 tablet; Refill: 1 - lisinopril-hydrochlorothiazide (ZESTORETIC) 20-12.5 MG tablet; Take 2 tablets by mouth daily.  Dispense: 180 tablet; Refill: 1 - CMP14+EGFR  2. Chronic bilateral low back pain with right-sided sciatica Uncontrolled Advised to speak with his pain clinic about running out of his opioid analgesic Apply heat - DULoxetine (CYMBALTA) 60 MG capsule; Take 1 capsule (60 mg total) by mouth daily. For back pain  Dispense: 90 capsule; Refill: 1 - DG Lumbar Spine Complete; Future  3. Smoking greater than 20  pack years Smoking cessation support: smoking cessation hotline: 1-800-QUIT-NOW.  Smoking cessation classes are available through [facility] and [facility]. Call [phone] or visit our website at [email or web address]  Spent 3 minutes counseling on dangers of tobacco use and benefits of quitting, offered pharmacological intervention to aid quitting and patient is not ready to quit.  - CT CHEST LUNG CANCER SCREENING LOW DOSE WO CONTRAST; Future  4. Hip pain, chronic, unspecified laterality Possibly underlying osteoarthritis Continue gabapentin, muscle relaxant, duloxetine and opioid analgesic for pain management - XR HIPS BILAT W OR W/O PELVIS 2V  5. History of prostate cancer - PSA, total and free  6. Flu vaccine need - Flu Vaccine QUAD High Dose(Fluad)    Meds ordered this encounter  Medications   amLODipine (NORVASC) 5 MG tablet    Sig: Take 1 tablet (5 mg total) by mouth daily.     Dispense:  90 tablet    Refill:  1    Dose increase   DULoxetine (CYMBALTA) 60 MG capsule    Sig: Take 1 capsule (60 mg total) by mouth daily. For back pain    Dispense:  90 capsule    Refill:  1   lisinopril-hydrochlorothiazide (ZESTORETIC) 20-12.5 MG tablet    Sig: Take 2 tablets by mouth daily.    Dispense:  180 tablet    Refill:  1    Follow-up: Return in about 1 month (around 07/10/2022) for Blood Pressure follow-up.       Charlott Rakes, MD, FAAFP. [facility] and [place], [place]   06/11/2022, 9:23 AM

## 2022-06-11 NOTE — Patient Instructions (Signed)
Hip Pain The hip is the joint between the upper legs and the lower pelvis. The bones, cartilage, tendons, and muscles of your hip joint support your body and allow you to move around. Hip pain can range from a minor ache to severe pain in one or both of your hips. The pain may be felt on the inside of the hip joint near the groin, or on the outside near the buttocks and upper thigh. You may also have swelling or stiffness in your hip area. Follow these instructions at home: Managing pain, stiffness, and swelling     If directed, put ice on the painful area. To do this: Put ice in a plastic bag. Place a towel between your skin and the bag. Leave the ice on for 20 minutes, 2-3 times a day. If directed, apply heat to the affected area as often as told by your health care provider. Use the heat source that your health care provider recommends, such as a moist heat pack or a heating pad. Place a towel between your skin and the heat source. Leave the heat on for 20-30 minutes. Remove the heat if your skin turns bright red. This is especially important if you are unable to feel pain, heat, or cold. You may have a greater risk of getting burned. Activity Do exercises as told by your health care provider. Avoid activities that cause pain. General instructions  Take over-the-counter and prescription medicines only as told by your health care provider. Keep a journal of your symptoms. Write down: How often you have hip pain. The location of your pain. What the pain feels like. What makes the pain worse. Sleep with a pillow between your legs on your most comfortable side. Keep all follow-up visits as told by your health care provider. This is important. Contact a health care provider if: You cannot put weight on your leg. Your pain or swelling continues or gets worse after one week. It gets harder to walk. You have a fever. Get help right away if: You fall. You have a sudden increase in pain  and swelling in your hip. Your hip is red or swollen or very tender to touch. Summary Hip pain can range from a minor ache to severe pain in one or both of your hips. The pain may be felt on the inside of the hip joint near the groin, or on the outside near the buttocks and upper thigh. Avoid activities that cause pain. Write down how often you have hip pain, the location of the pain, what makes it worse, and what it feels like. This information is not intended to replace advice given to you by your health care provider. Make sure you discuss any questions you have with your health care provider. Document Revised: 08/22/2018 Document Reviewed: 08/22/2018 Elsevier Patient Education  2023 [employer]  

## 2022-06-11 NOTE — Progress Notes (Signed)
Pain in right hip No BP medications in 2 days.

## 2022-06-12 LAB — CMP14+EGFR
ALT: 11 IU/L (ref 0–44)
AST: 18 IU/L (ref 0–40)
Albumin/Globulin Ratio: 1.6 (ref 1.2–2.2)
Albumin: 4.5 g/dL (ref 3.9–4.9)
Alkaline Phosphatase: 51 IU/L (ref 44–121)
BUN/Creatinine Ratio: 13 (ref 10–24)
BUN: 19 mg/dL (ref 8–27)
Bilirubin Total: 0.5 mg/dL (ref 0.0–1.2)
CO2: 22 mmol/L (ref 20–29)
Calcium: 9.8 mg/dL (ref 8.6–10.2)
Chloride: 102 mmol/L (ref 96–106)
Creatinine, Ser: 1.51 mg/dL — ABNORMAL HIGH (ref 0.76–1.27)
Globulin, Total: 2.8 g/dL (ref 1.5–4.5)
Glucose: 82 mg/dL (ref 70–99)
Potassium: 4.8 mmol/L (ref 3.5–5.2)
Sodium: 140 mmol/L (ref 134–144)
Total Protein: 7.3 g/dL (ref 6.0–8.5)
eGFR: 49 mL/min/{1.73_m2} — ABNORMAL LOW (ref >59)

## 2022-06-12 LAB — PSA, TOTAL AND FREE
PSA, Free Pct: 14.5 %
PSA, Free: 0.81 ng/mL
Prostate Specific Ag, Serum: 5.6 ng/mL — ABNORMAL HIGH (ref 0.0–4.0)

## 2022-06-16 ENCOUNTER — Ambulatory Visit: Payer: Medicare HMO | Attending: Family Medicine

## 2022-06-22 DIAGNOSIS — G894 Chronic pain syndrome: Secondary | ICD-10-CM | POA: Diagnosis not present

## 2022-06-22 DIAGNOSIS — R030 Elevated blood-pressure reading, without diagnosis of hypertension: Secondary | ICD-10-CM | POA: Diagnosis not present

## 2022-06-22 DIAGNOSIS — R7989 Other specified abnormal findings of blood chemistry: Secondary | ICD-10-CM | POA: Diagnosis not present

## 2022-06-22 DIAGNOSIS — M5136 Other intervertebral disc degeneration, lumbar region: Secondary | ICD-10-CM | POA: Diagnosis not present

## 2022-06-22 DIAGNOSIS — Z79899 Other long term (current) drug therapy: Secondary | ICD-10-CM | POA: Diagnosis not present

## 2022-06-22 DIAGNOSIS — M25551 Pain in right hip: Secondary | ICD-10-CM | POA: Diagnosis not present

## 2022-06-22 DIAGNOSIS — Z6822 Body mass index (BMI) 22.0-22.9, adult: Secondary | ICD-10-CM | POA: Diagnosis not present

## 2022-06-24 DIAGNOSIS — Z79899 Other long term (current) drug therapy: Secondary | ICD-10-CM | POA: Diagnosis not present

## 2022-07-15 ENCOUNTER — Ambulatory Visit: Payer: Medicare HMO | Attending: Family Medicine | Admitting: Family Medicine

## 2022-07-15 ENCOUNTER — Encounter: Payer: Self-pay | Admitting: Family Medicine

## 2022-07-15 VITALS — BP 109/72 | HR 96 | Ht 66.0 in | Wt 132.4 lb

## 2022-07-15 DIAGNOSIS — M5441 Lumbago with sciatica, right side: Secondary | ICD-10-CM | POA: Diagnosis not present

## 2022-07-15 DIAGNOSIS — I10 Essential (primary) hypertension: Secondary | ICD-10-CM

## 2022-07-15 DIAGNOSIS — G8929 Other chronic pain: Secondary | ICD-10-CM | POA: Diagnosis not present

## 2022-07-15 MED ORDER — PREDNISONE 20 MG PO TABS: 20.0000 mg | ORAL_TABLET | Freq: Every day | ORAL | 0 refills | Status: DC

## 2022-07-15 NOTE — Progress Notes (Signed)
Subjective:  Patient ID: Timothy [name], male    DOB: [birth year]  Age: 72 y.o. MRN: [id number]  CC: Hypertension   HPI Timothy [name] is a 72 y.o. year old male with a history of Hypertension, chronic low back pain, history of Prostate ca, smokes cigars (since he was 17)   Interval History:  His BP was elevated at his last visit and he had run out of his medications which were refilled.  Blood pressure is controlled today. He continues to have chronic low back pain which is not controlled on his current pain medications.  Denies presence of radiculopathy, falls.  He does not ambulate with a cane.  He follows up with pain management on 07/23/2021. Past Medical History:  Diagnosis Date   Chronic kidney disease (CKD), stage II (mild)    Dehydration    Essential hypertension    Low back pain with radiation    Personal history of prostate cancer     Past Surgical History:  Procedure Laterality Date   COLONOSCOPY     PROSTATE SURGERY      Family History  Problem Relation Age of Onset   Hypertension Mother    Cancer Neg Hx    Diabetes Neg Hx    CVA Neg Hx    Colon cancer Neg Hx    Colon polyps Neg Hx    Crohn's disease Neg Hx    Rectal cancer Neg Hx    Stomach cancer Neg Hx    Ulcerative colitis Neg Hx     Social History   Socioeconomic History   Marital status: Single    Spouse name: Not on file   Number of children: Not on file   Years of education: Not on file   Highest education level: Not on file  Occupational History   Not on file  Tobacco Use   Smoking status: Some Days    Types: Cigars    Passive exposure: Never   Smokeless tobacco: Never  Vaping Use   Vaping Use: Never used  Substance and Sexual Activity   Alcohol use: Not Currently   Drug use: Yes    Types: Marijuana    Comment: LAST SMOKED 12/13/21,UPDATED 12/18/21   Sexual activity: Not Currently  Other Topics Concern   Not on file  Social History Narrative   Not on file   Social  Determinants of Health   Financial Resource Strain: Low Risk  (05/13/2021)   Overall Financial Resource Strain (CARDIA)    Difficulty of Paying Living Expenses: Not very hard  Food Insecurity: No Food Insecurity (05/13/2021)   Hunger Vital Sign    Worried About Running Out of Food in the Last Year: Never true    Ran Out of Food in the Last Year: Never true  Transportation Needs: No Transportation Needs (05/13/2021)   PRAPARE - Hydrologist (Medical): No    Lack of Transportation (Non-Medical): No  Physical Activity: Sufficiently Active (05/13/2021)   Exercise Vital Sign    Days of Exercise per Week: 7 days    Minutes of Exercise per Session: 30 min  Stress: No Stress Concern Present (05/13/2021)   [place]    Feeling of Stress : Not at all  Social Connections: Moderately Integrated (05/13/2021)   Social Connection and Isolation Panel [NHANES]    Frequency of Communication with Friends and Family: More than three times a week    Frequency of  Social Gatherings with Friends and Family: More than three times a week    Attends Religious Services: More than 4 times per year    Active Member of [employer] or Organizations: Yes    Attends Music therapist: More than 4 times per year    Marital Status: Divorced    No Known Allergies  Outpatient Medications Prior to Visit  Medication Sig Dispense Refill   amLODipine (NORVASC) 5 MG tablet Take 1 tablet (5 mg total) by mouth daily. 90 tablet 1   baclofen (LIORESAL) 10 MG tablet Take 10 mg by mouth 3 (three) times daily.     cyproheptadine (PERIACTIN) 4 MG tablet Take 1 tab PO TID PRN for appetite 30 tablet 0   DULoxetine (CYMBALTA) 60 MG capsule Take 1 capsule (60 mg total) by mouth daily. For back pain 90 capsule 1   gabapentin (NEURONTIN) 300 MG capsule Take 1 capsule (300 mg total) by mouth 3 (three) times daily as needed. 270 capsule 1    lisinopril-hydrochlorothiazide (ZESTORETIC) 20-12.5 MG tablet Take 2 tablets by mouth daily. 180 tablet 1   meloxicam (MOBIC) 7.5 MG tablet Take 2 tablets (15 mg total) by mouth daily. 60 tablet 3   HYDROcodone-acetaminophen (NORCO/VICODIN) 5-325 MG tablet as needed. (Patient not taking: Reported on 07/15/2022)     No facility-administered medications prior to visit.     ROS Review of Systems  Constitutional:  Negative for activity change and appetite change.  HENT:  Negative for sinus pressure and sore throat.   Respiratory:  Negative for chest tightness, shortness of breath and wheezing.   Cardiovascular:  Negative for chest pain and palpitations.  Gastrointestinal:  Negative for abdominal distention, abdominal pain and constipation.  Genitourinary: Negative.   Musculoskeletal:        See HPI  Psychiatric/Behavioral:  Negative for behavioral problems and dysphoric mood.     Objective:  BP 109/72   Pulse 96   Ht 5\' 6"  (1.676 m)   Wt 132 lb 6.4 oz (60.1 kg)   SpO2 99%   BMI 21.37 kg/m      07/15/2022   10:15 AM 06/11/2022    8:44 AM 04/21/2022    2:30 PM  BP/Weight  Systolic BP 0000000 0000000 0000000  Diastolic BP 72 91 [id number]  Wt. (Lbs) 132.4 140.4   BMI 21.37 kg/m2 24.1 kg/m2       Physical Exam Constitutional:      Appearance: He is well-developed.  Cardiovascular:     Rate and Rhythm: Normal rate.     Heart sounds: Normal heart sounds. No murmur heard. Pulmonary:     Effort: Pulmonary effort is normal.     Breath sounds: Normal breath sounds. No wheezing or rales.  Chest:     Chest wall: No tenderness.  Abdominal:     General: Bowel sounds are normal. There is no distension.     Palpations: Abdomen is soft. There is no mass.     Tenderness: There is no abdominal tenderness.  Musculoskeletal:     Right lower leg: No edema.     Left lower leg: No edema.     Comments: Tenderness on palpation of entire lumbar spine and on range of motion   Neurological:     Mental  Status: He is alert and oriented to person, place, and time.  Psychiatric:        Mood and Affect: Mood normal.        Latest Ref Rng & Units 06/11/2022  9:12 AM 04/21/2022   11:35 AM 12/09/2021   11:30 AM  CMP  Glucose 70 - 99 mg/dL 82  95  83   BUN 8 - 27 mg/dL 19  19  18    Creatinine 0.76 - 1.27 mg/dL 1.51  1.64  1.49   Sodium 134 - 144 mmol/L 140  138  138   Potassium 3.5 - 5.2 mmol/L 4.8  4.6  5.0   Chloride 96 - 106 mmol/L 102  102  99   CO2 20 - 29 mmol/L 22  28  20    Calcium 8.6 - 10.2 mg/dL 9.8  10.4  9.9   Total Protein 6.0 - 8.5 g/dL 7.3     Total Bilirubin 0.0 - 1.2 mg/dL 0.5     Alkaline Phos 44 - 121 IU/L 51     AST 0 - 40 IU/L 18     ALT 0 - 44 IU/L 11       Lipid Panel     Component Value Date/Time   CHOL 128 05/27/2021 0911   TRIG 97 05/27/2021 0911   HDL 39 (L) 05/27/2021 0911   CHOLHDL 3.1 07/22/2020 1532   CHOLHDL 5.0 Ratio 05/06/2010 2038   VLDL 37 05/06/2010 2038   LDLCALC 71 05/27/2021 0911    CBC    Component Value Date/Time   WBC 7.7 04/21/2022 1135   RBC 4.75 04/21/2022 1135   HGB 15.4 04/21/2022 1135   HGB 13.8 02/10/2019 1122   HCT 46.5 04/21/2022 1135   HCT 41.7 02/10/2019 1122   PLT 220 04/21/2022 1135   PLT 198 02/10/2019 1122   MCV 97.9 04/21/2022 1135   MCV 94 02/10/2019 1122   MCH 32.4 04/21/2022 1135   MCHC 33.1 04/21/2022 1135   RDW 15.5 04/21/2022 1135   RDW 13.0 02/10/2019 1122   LYMPHSABS 1.5 12/30/2020 1656   LYMPHSABS 2.8 02/10/2019 1122   MONOABS 0.7 12/30/2020 1656   EOSABS 0.3 12/30/2020 1656   EOSABS 0.5 (H) 02/10/2019 1122   BASOSABS 0.0 12/30/2020 1656   BASOSABS 0.0 02/10/2019 1122    No results found for: "HGBA1C"  Assessment & Plan:  1. Essential hypertension Controlled Continue amlodipine, lisinopril/HCTZ Counseled on blood pressure goal of less than 130/80, low-sodium, DASH diet, medication compliance, 150 minutes of moderate intensity exercise per week. Discussed medication compliance, adverse  effects.  2. Chronic bilateral low back pain with right-sided sciatica Uncontrolled Short course of prednisone added Continue with gabapentin, Cymbalta, muscle relaxant, Mobic. Advised to discuss during his pain management appointment that his pain is not adequately controlled Advised to apply heat - predniSONE (DELTASONE) 20 MG tablet; Take 1 tablet (20 mg total) by mouth daily with breakfast.  Dispense: 5 tablet; Refill: 0   Health Care Maintenance: Last low-dose CT chest from 05/2021 was negative for malignancy. Repeat ordered in 05/2022.  Will have my CMA follow-up with the imaging center to schedule this for him. Meds ordered this encounter  Medications   predniSONE (DELTASONE) 20 MG tablet    Sig: Take 1 tablet (20 mg total) by mouth daily with breakfast.    Dispense:  5 tablet    Refill:  0    Follow-up: Return in about 6 months (around 01/15/2023) for Chronic medical conditions.       Charlott Rakes, MD, FAAFP. [facility] and [place] [name], [place]   07/15/2022, 10:51 AM

## 2022-07-15 NOTE — Patient Instructions (Signed)

## 2022-07-24 ENCOUNTER — Ambulatory Visit: Payer: Medicare HMO | Attending: Family Medicine

## 2022-07-24 DIAGNOSIS — Z0000 Encounter for general adult medical examination without abnormal findings: Secondary | ICD-10-CM

## 2022-07-24 NOTE — Progress Notes (Signed)
Subjective:   Timothy [name] is a 72 y.o. male who presents for Medicare Annual/Subsequent preventive examination.  Review of Systems    I connected with  Timothy [name] on 07/24/22 by a audio enabled telemedicine application and verified that I am speaking with the correct person using two identifiers.  Patient Location: Home  Provider Location: Office/Clinic  I discussed the limitations of evaluation and management by telemedicine. The patient expressed understanding and agreed to proceed.  Cardiac Risk Factors include: none     Objective:    There were no vitals filed for this visit. There is no height or weight on file to calculate BMI.     07/24/2022    9:38 AM 04/21/2022   11:32 AM 12/30/2020    4:51 PM 11/22/2017    1:44 PM 07/09/2017    5:31 PM 06/25/2017    3:07 PM 06/04/2017    4:00 PM  Advanced Directives  Does Patient Have a Medical Advance Directive? No No No No No No No  Would patient like information on creating a medical advance directive? Yes (ED - Information included in AVS) No - Patient declined  No - Patient declined No - Patient declined No - Patient declined     Current Medications (verified) Outpatient Encounter Medications as of 07/24/2022  Medication Sig   amLODipine (NORVASC) 5 MG tablet Take 1 tablet (5 mg total) by mouth daily.   baclofen (LIORESAL) 10 MG tablet Take 10 mg by mouth 3 (three) times daily.   cyproheptadine (PERIACTIN) 4 MG tablet Take 1 tab PO TID PRN for appetite   DULoxetine (CYMBALTA) 60 MG capsule Take 1 capsule (60 mg total) by mouth daily. For back pain   gabapentin (NEURONTIN) 300 MG capsule Take 1 capsule (300 mg total) by mouth 3 (three) times daily as needed.   HYDROcodone-acetaminophen (NORCO/VICODIN) 5-325 MG tablet as needed.   lisinopril-hydrochlorothiazide (ZESTORETIC) 20-12.5 MG tablet Take 2 tablets by mouth daily.   meloxicam (MOBIC) 7.5 MG tablet Take 2 tablets (15 mg total) by mouth daily.   predniSONE (DELTASONE) 20  MG tablet Take 1 tablet (20 mg total) by mouth daily with breakfast.   No facility-administered encounter medications on file as of 07/24/2022.    Allergies (verified) Patient has no known allergies.   History: Past Medical History:  Diagnosis Date   Chronic kidney disease (CKD), stage II (mild)    Dehydration    Essential hypertension    Low back pain with radiation    Personal history of prostate cancer    Past Surgical History:  Procedure Laterality Date   COLONOSCOPY     PROSTATE SURGERY     Family History  Problem Relation Age of Onset   Hypertension Mother    Cancer Neg Hx    Diabetes Neg Hx    CVA Neg Hx    Colon cancer Neg Hx    Colon polyps Neg Hx    Crohn's disease Neg Hx    Rectal cancer Neg Hx    Stomach cancer Neg Hx    Ulcerative colitis Neg Hx    Social History   Socioeconomic History   Marital status: Single    Spouse name: Not on file   Number of children: Not on file   Years of education: Not on file   Highest education level: Not on file  Occupational History   Not on file  Tobacco Use   Smoking status: Some Days    Types: Cigars  Passive exposure: Never   Smokeless tobacco: Never  Vaping Use   Vaping Use: Never used  Substance and Sexual Activity   Alcohol use: Not Currently   Drug use: Yes    Types: Marijuana    Comment: LAST SMOKED 12/13/21,UPDATED 12/18/21   Sexual activity: Not Currently  Other Topics Concern   Not on file  Social History Narrative   Not on file   Social Determinants of Health   Financial Resource Strain: Low Risk  (07/24/2022)   Overall Financial Resource Strain (CARDIA)    Difficulty of Paying Living Expenses: Not hard at all  Food Insecurity: No Food Insecurity (07/24/2022)   Hunger Vital Sign    Worried About Running Out of Food in the Last Year: Never true    Ran Out of Food in the Last Year: Never true  Transportation Needs: No Transportation Needs (07/24/2022)   PRAPARE - Scientist, research (physical sciences) (Medical): No    Lack of Transportation (Non-Medical): No  Physical Activity: Sufficiently Active (07/24/2022)   Exercise Vital Sign    Days of Exercise per Week: 7 days    Minutes of Exercise per Session: 30 min  Stress: No Stress Concern Present (07/24/2022)   [employer] of Occupational Health - Occupational Stress Questionnaire    Feeling of Stress : Not at all  Social Connections: Socially Isolated (07/24/2022)   Social Connection and Isolation Panel [NHANES]    Frequency of Communication with Friends and Family: Twice a week    Frequency of Social Gatherings with Friends and Family: More than three times a week    Attends Religious Services: Never    Database administrator or Organizations: No    Attends Engineer, structural: Never    Marital Status: Divorced    Tobacco Counseling Ready to quit: Not Answered Counseling given: Not Answered   Clinical Intake:  Pre-visit preparation completed: No  Pain : No/denies pain     Nutritional Risks: None Diabetes: No  How often do you need to have someone help you when you read instructions, pamphlets, or other written materials from your doctor or pharmacy?: 1 - Never  Diabetic?No  Interpreter Needed?: No      Activities of Daily Living    07/24/2022    9:38 AM  In your present state of health, do you have any difficulty performing the following activities:  Hearing? 0  Vision? 0  Difficulty concentrating or making decisions? 0  Walking or climbing stairs? 0  Dressing or bathing? 0  Doing errands, shopping? 0  Preparing Food and eating ? N  Using the Toilet? N  In the past six months, have you accidently leaked urine? N  Do you have problems with loss of bowel control? N  Managing your Medications? N  Managing your Finances? N  Housekeeping or managing your Housekeeping? N    Patient Care Team: Hoy Register, MD as PCP - General (Family Medicine)  Indicate any recent Medical  Services you may have received from other than [facility] providers in the past year (date may be approximate).     Assessment:   This is a routine wellness examination for Timothy [name].  Hearing/Vision screen No results found.  Dietary issues and exercise activities discussed: Current Exercise Habits: Home exercise routine, Type of exercise: walking, Time (Minutes): 30, Frequency (Times/Week): 7, Weekly Exercise (Minutes/Week): 210, Intensity: Mild, Exercise limited by: None identified   Goals Addressed   None    Depression Screen  07/24/2022    9:37 AM 05/27/2021    8:46 AM 02/20/2021   10:16 AM 07/22/2020    2:38 PM 02/14/2020   10:52 AM 05/29/2019    8:24 AM 08/11/2018    8:41 AM  PHQ 2/9 Scores  PHQ - 2 Score 0  0 1 0 2 0  PHQ- 9 Score   0 3   0  Exception Documentation  Patient refusal         Fall Risk    07/24/2022    9:38 AM 07/15/2022   10:14 AM 06/11/2022    8:45 AM 12/09/2021   10:20 AM 05/27/2021    8:46 AM  Fall Risk   Falls in the past year? 0 0 0 0 0  Number falls in past yr: 0 0 0 0 0  Injury with Fall? 0 0 0 0 0  Risk for fall due to : No Fall Risks        FALL RISK PREVENTION PERTAINING TO THE HOME:  Any stairs in or around the home? Yes  If so, are there any without handrails? Yes  Home free of loose throw rugs in walkways, pet beds, electrical cords, etc? Yes  Adequate lighting in your home to reduce risk of falls? Yes   ASSISTIVE DEVICES UTILIZED TO PREVENT FALLS:  Life alert? No  Use of a cane, walker or w/c? No  Grab bars in the bathroom? Yes  Shower chair or bench in shower? No  Elevated toilet seat or a handicapped toilet? Yes   TIMED UP AND GO:  Was the test performed? No .  Length of time to ambulate 10 feet: N/A sec.   Gait slow and steady without use of assistive device  Cognitive Function:    07/24/2022    9:40 AM  MMSE - Mini Mental State Exam  Not completed: Unable to complete        Immunizations Immunization History  Administered  Date(s) Administered   Fluad Quad(high Dose 65+) 06/11/2022   Influenza, High Dose Seasonal PF 12/20/2018   Influenza,inj,Quad PF,6+ Mos 06/25/2017, 01/26/2018, 02/14/2020, 01/01/2021   PNEUMOCOCCAL CONJUGATE-20 02/20/2021   Pneumococcal Polysaccharide-23 11/22/2017   Tdap 01/29/2018   Zoster Recombinat (Shingrix) 02/20/2021    TDAP status: Up to date  Flu Vaccine status: Up to date  Pneumococcal vaccine status: Up to date  Covid-19 vaccine status: Completed vaccines  Qualifies for Shingles Vaccine? Yes   Zostavax completed Yes   Shingrix Completed?: Yes  Screening Tests Health Maintenance  Topic Date Due   COVID-19 Vaccine (1) Never done   Zoster Vaccines- Shingrix (2 of 2) 04/17/2021   Medicare Annual Wellness (AWV)  05/13/2022   Lung Cancer Screening  06/12/2022   INFLUENZA VACCINE  11/19/2022   DTaP/Tdap/Td (2 - Td or Tdap) 01/30/2028   Pneumonia Vaccine 3765+ Years old  Completed   Hepatitis C Screening  Completed   HPV VACCINES  Aged Out   COLONOSCOPY (Pts [id number] Insurance coverage will need to be confirmed)  Discontinued    Health Maintenance  Health Maintenance Due  Topic Date Due   COVID-19 Vaccine (1) Never done   Zoster Vaccines- Shingrix (2 of 2) 04/17/2021   Medicare Annual Wellness (AWV)  05/13/2022   Lung Cancer Screening  06/12/2022    Colorectal cancer screening: No longer required.   Lung Cancer Screening: (Low Dose CT Chest recommended if Age 83-80 years, 30 pack-year currently smoking OR have quit w/in 15years.) does qualify.   Lung Cancer Screening  Referral: N/A  Additional Screening:  Hepatitis C Screening: does qualify; Completed 02/20/2021  Vision Screening: Recommended annual ophthalmology exams for early detection of glaucoma and other disorders of the eye. Is the patient up to date with their annual eye exam?  No  Who is the provider or what is the name of the office in which the patient attends annual eye exams? N/A If pt is not  established with a provider, would they like to be referred to a provider to establish care? Yes .   Dental Screening: Recommended annual dental exams for proper oral hygiene  Community Resource Referral / Chronic Care Management: CRR required this visit?  No   CCM required this visit?  No      Plan:     I have personally reviewed and noted the following in the patient's chart:   Medical and social history Use of alcohol, tobacco or illicit drugs  Current medications and supplements including opioid prescriptions. Patient is currently taking opioid prescriptions. Information provided to patient regarding non-opioid alternatives. Patient advised to discuss non-opioid treatment plan with their provider. Functional ability and status Nutritional status Physical activity Advanced directives List of other physicians Hospitalizations, surgeries, and ER visits in previous 12 months Vitals Screenings to include cognitive, depression, and falls Referrals and appointments  In addition, I have reviewed and discussed with patient certain preventive protocols, quality metrics, and best practice recommendations. A written personalized care plan for preventive services as well as general preventive health recommendations were provided to patient.     Ronette Deter, CMA   07/24/2022   Nurse Notes: I spent 25 minutes on this telephone encounter AVS mailed to patinet

## 2022-07-24 NOTE — Patient Instructions (Addendum)
Timothy [name] , Thank you for taking time to come for your Medicare Wellness Visit. I appreciate your ongoing commitment to your health goals. Please review the following plan we discussed and let me know if I can assist you in the future.   These are the goals we discussed:  Goals   None     This is a list of the screening recommended for you and due dates:  Health Maintenance  Topic Date Due   COVID-19 Vaccine (1) Never done   Zoster (Shingles) Vaccine (2 of 2) 04/17/2021   Screening for Lung Cancer  06/12/2022   Flu Shot  11/19/2022   Medicare Annual Wellness Visit  07/24/2023   DTaP/Tdap/Td vaccine (2 - Td or Tdap) 01/30/2028   Pneumonia Vaccine  Completed   Hepatitis C Screening: USPSTF Recommendation to screen - Ages 7318-79 yo.  Completed   HPV Vaccine  Aged Out   Colon Cancer Screening  Discontinued   Health Maintenance After Age 71 After age 365, you are at a higher risk for certain long-term diseases and infections as well as injuries from falls. Falls are a major cause of broken bones and head injuries in people who are older than age 71. Getting regular preventive care can help to keep you healthy and well. Preventive care includes getting regular testing and making lifestyle changes as recommended by your health care provider. Talk with your health care provider about: Which screenings and tests you should have. A screening is a test that checks for a disease when you have no symptoms. A diet and exercise plan that is right for you. What should I know about screenings and tests to prevent falls? Screening and testing are the best ways to find a health problem early. Early diagnosis and treatment give you the best chance of managing medical conditions that are common after age 71. Certain conditions and lifestyle choices may make you more likely to have a fall. Your health care provider may recommend: Regular vision checks. Poor vision and conditions such as cataracts can make  you more likely to have a fall. If you wear glasses, make sure to get your prescription updated if your vision changes. Medicine review. Work with your health care provider to regularly review all of the medicines you are taking, including over-the-counter medicines. Ask your health care provider about any side effects that may make you more likely to have a fall. Tell your health care provider if any medicines that you take make you feel dizzy or sleepy. Strength and balance checks. Your health care provider may recommend certain tests to check your strength and balance while standing, walking, or changing positions. Foot health exam. Foot pain and numbness, as well as not wearing proper footwear, can make you more likely to have a fall. Screenings, including: Osteoporosis screening. Osteoporosis is a condition that causes the bones to get weaker and break more easily. Blood pressure screening. Blood pressure changes and medicines to control blood pressure can make you feel dizzy. Depression screening. You may be more likely to have a fall if you have a fear of falling, feel depressed, or feel unable to do activities that you used to do. Alcohol use screening. Using too much alcohol can affect your balance and may make you more likely to have a fall. Follow these instructions at home: Lifestyle Do not drink alcohol if: Your health care provider tells you not to drink. If you drink alcohol: Limit how much you have to: 0-1 drink  a day for women. 0-2 drinks a day for men. Know how much alcohol is in your drink. In the [place] one drink equals one 12 oz bottle of beer (355 mL), one 5 oz glass of wine (148 mL), or one 1 oz glass of hard liquor (44 mL). Do not use any products that contain nicotine or tobacco. These products include cigarettes, chewing tobacco, and vaping devices, such as e-cigarettes. If you need help quitting, ask your health care provider. Activity  Follow a regular exercise  program to stay fit. This will help you maintain your balance. Ask your health care provider what types of exercise are appropriate for you. If you need a cane or walker, use it as recommended by your health care provider. Wear supportive shoes that have nonskid soles. Safety  Remove any tripping hazards, such as rugs, cords, and clutter. Install safety equipment such as grab bars in bathrooms and safety rails on stairs. Keep rooms and walkways well-lit. General instructions Talk with your health care provider about your risks for falling. Tell your health care provider if: You fall. Be sure to tell your health care provider about all falls, even ones that seem minor. You feel dizzy, tiredness (fatigue), or off-balance. Take over-the-counter and prescription medicines only as told by your health care provider. These include supplements. Eat a healthy diet and maintain a healthy weight. A healthy diet includes low-fat dairy products, low-fat (lean) meats, and fiber from whole grains, beans, and lots of fruits and vegetables. Stay current with your vaccines. Schedule regular health, dental, and eye exams. Summary Having a healthy lifestyle and getting preventive care can help to protect your health and wellness after age 95. Screening and testing are the best way to find a health problem early and help you avoid having a fall. Early diagnosis and treatment give you the best chance for managing medical conditions that are more common for people who are older than age 23. Falls are a major cause of broken bones and head injuries in people who are older than age 61. Take precautions to prevent a fall at home. Work with your health care provider to learn what changes you can make to improve your health and wellness and to prevent falls. This information is not intended to replace advice given to you by your health care provider. Make sure you discuss any questions you have with your health care  provider. Document Revised: 08/26/2020 Document Reviewed: 08/26/2020 Elsevier Patient Education  2023 [employer].

## 2022-07-30 DIAGNOSIS — Z6822 Body mass index (BMI) 22.0-22.9, adult: Secondary | ICD-10-CM | POA: Diagnosis not present

## 2022-07-30 DIAGNOSIS — G894 Chronic pain syndrome: Secondary | ICD-10-CM | POA: Diagnosis not present

## 2022-07-30 DIAGNOSIS — M5136 Other intervertebral disc degeneration, lumbar region: Secondary | ICD-10-CM | POA: Diagnosis not present

## 2022-07-30 DIAGNOSIS — Z79899 Other long term (current) drug therapy: Secondary | ICD-10-CM | POA: Diagnosis not present

## 2022-07-30 DIAGNOSIS — R7989 Other specified abnormal findings of blood chemistry: Secondary | ICD-10-CM | POA: Diagnosis not present

## 2022-07-30 DIAGNOSIS — M25551 Pain in right hip: Secondary | ICD-10-CM | POA: Diagnosis not present

## 2022-08-04 DIAGNOSIS — Z79899 Other long term (current) drug therapy: Secondary | ICD-10-CM | POA: Diagnosis not present

## 2022-08-14 ENCOUNTER — Ambulatory Visit
Admission: RE | Admit: 2022-08-14 | Discharge: 2022-08-14 | Disposition: A | Discharge status: Home / Self Care | Payer: Medicare HMO | Source: Ambulatory Visit | Attending: Family Medicine | Admitting: Family Medicine

## 2022-08-14 DIAGNOSIS — F17210 Nicotine dependence, cigarettes, uncomplicated: Secondary | ICD-10-CM

## 2022-08-28 DIAGNOSIS — Z79899 Other long term (current) drug therapy: Secondary | ICD-10-CM | POA: Diagnosis not present

## 2022-08-28 DIAGNOSIS — R7989 Other specified abnormal findings of blood chemistry: Secondary | ICD-10-CM | POA: Diagnosis not present

## 2022-08-28 DIAGNOSIS — M25551 Pain in right hip: Secondary | ICD-10-CM | POA: Diagnosis not present

## 2022-08-28 DIAGNOSIS — G894 Chronic pain syndrome: Secondary | ICD-10-CM | POA: Diagnosis not present

## 2022-08-28 DIAGNOSIS — Z6821 Body mass index (BMI) 21.0-21.9, adult: Secondary | ICD-10-CM | POA: Diagnosis not present

## 2022-08-28 DIAGNOSIS — M5136 Other intervertebral disc degeneration, lumbar region: Secondary | ICD-10-CM | POA: Diagnosis not present

## 2022-09-03 DIAGNOSIS — Z79899 Other long term (current) drug therapy: Secondary | ICD-10-CM | POA: Diagnosis not present

## 2022-09-28 DIAGNOSIS — Z79899 Other long term (current) drug therapy: Secondary | ICD-10-CM | POA: Diagnosis not present

## 2022-09-28 DIAGNOSIS — M5136 Other intervertebral disc degeneration, lumbar region: Secondary | ICD-10-CM | POA: Diagnosis not present

## 2022-09-28 DIAGNOSIS — Z6821 Body mass index (BMI) 21.0-21.9, adult: Secondary | ICD-10-CM | POA: Diagnosis not present

## 2022-09-28 DIAGNOSIS — R7989 Other specified abnormal findings of blood chemistry: Secondary | ICD-10-CM | POA: Diagnosis not present

## 2022-09-28 DIAGNOSIS — G894 Chronic pain syndrome: Secondary | ICD-10-CM | POA: Diagnosis not present

## 2022-10-05 DIAGNOSIS — Z79899 Other long term (current) drug therapy: Secondary | ICD-10-CM | POA: Diagnosis not present

## 2022-10-27 DIAGNOSIS — M5136 Other intervertebral disc degeneration, lumbar region: Secondary | ICD-10-CM | POA: Diagnosis not present

## 2022-10-27 DIAGNOSIS — Z6821 Body mass index (BMI) 21.0-21.9, adult: Secondary | ICD-10-CM | POA: Diagnosis not present

## 2022-10-27 DIAGNOSIS — R7989 Other specified abnormal findings of blood chemistry: Secondary | ICD-10-CM | POA: Diagnosis not present

## 2022-10-27 DIAGNOSIS — Z79899 Other long term (current) drug therapy: Secondary | ICD-10-CM | POA: Diagnosis not present

## 2022-10-27 DIAGNOSIS — G894 Chronic pain syndrome: Secondary | ICD-10-CM | POA: Diagnosis not present

## 2022-11-27 DIAGNOSIS — G894 Chronic pain syndrome: Secondary | ICD-10-CM | POA: Diagnosis not present

## 2022-11-27 DIAGNOSIS — Z6821 Body mass index (BMI) 21.0-21.9, adult: Secondary | ICD-10-CM | POA: Diagnosis not present

## 2022-11-27 DIAGNOSIS — Z79899 Other long term (current) drug therapy: Secondary | ICD-10-CM | POA: Diagnosis not present

## 2022-11-27 DIAGNOSIS — R7989 Other specified abnormal findings of blood chemistry: Secondary | ICD-10-CM | POA: Diagnosis not present

## 2022-11-27 DIAGNOSIS — M5136 Other intervertebral disc degeneration, lumbar region: Secondary | ICD-10-CM | POA: Diagnosis not present

## 2022-12-04 DIAGNOSIS — Z79899 Other long term (current) drug therapy: Secondary | ICD-10-CM | POA: Diagnosis not present

## 2022-12-28 DIAGNOSIS — F17210 Nicotine dependence, cigarettes, uncomplicated: Secondary | ICD-10-CM | POA: Diagnosis not present

## 2022-12-28 DIAGNOSIS — R030 Elevated blood-pressure reading, without diagnosis of hypertension: Secondary | ICD-10-CM | POA: Diagnosis not present

## 2022-12-28 DIAGNOSIS — F17200 Nicotine dependence, unspecified, uncomplicated: Secondary | ICD-10-CM | POA: Diagnosis not present

## 2022-12-28 DIAGNOSIS — Z79899 Other long term (current) drug therapy: Secondary | ICD-10-CM | POA: Diagnosis not present

## 2022-12-28 DIAGNOSIS — Z6820 Body mass index (BMI) 20.0-20.9, adult: Secondary | ICD-10-CM | POA: Diagnosis not present

## 2022-12-28 DIAGNOSIS — M5136 Other intervertebral disc degeneration, lumbar region: Secondary | ICD-10-CM | POA: Diagnosis not present

## 2022-12-28 DIAGNOSIS — Z9181 History of falling: Secondary | ICD-10-CM | POA: Diagnosis not present

## 2022-12-28 DIAGNOSIS — I10 Essential (primary) hypertension: Secondary | ICD-10-CM | POA: Diagnosis not present

## 2022-12-28 DIAGNOSIS — Z0000 Encounter for general adult medical examination without abnormal findings: Secondary | ICD-10-CM | POA: Diagnosis not present

## 2022-12-28 DIAGNOSIS — G894 Chronic pain syndrome: Secondary | ICD-10-CM | POA: Diagnosis not present

## 2023-01-04 DIAGNOSIS — Z79899 Other long term (current) drug therapy: Secondary | ICD-10-CM | POA: Diagnosis not present

## 2023-01-18 ENCOUNTER — Ambulatory Visit: Payer: Medicare HMO | Admitting: Family Medicine

## 2023-01-19 ENCOUNTER — Ambulatory Visit: Payer: Medicare HMO | Admitting: Family Medicine

## 2023-02-03 ENCOUNTER — Other Ambulatory Visit: Payer: Self-pay | Admitting: Pharmacist

## 2023-02-03 DIAGNOSIS — I10 Essential (primary) hypertension: Secondary | ICD-10-CM

## 2023-02-03 MED ORDER — LISINOPRIL-HYDROCHLOROTHIAZIDE 20-12.5 MG PO TABS: 2.0000 | ORAL_TABLET | Freq: Every day | ORAL | 0 refills | Status: DC

## 2023-02-03 MED ORDER — AMLODIPINE BESYLATE 5 MG PO TABS: 5.0000 mg | ORAL_TABLET | Freq: Every day | ORAL | 0 refills | Status: DC

## 2023-02-03 NOTE — Progress Notes (Signed)
Pharmacy Quality Measure Review  This patient is appearing on a report for being at risk of failing the adherence measure for hypertension (ACEi/ARB) medications this calendar year.   Medication: lisinopril-hydrochlorothiazide  Last fill date: 09/08/2022 for 90 day supply  Contacted pharmacy to facilitate refills. Sent in new prescription for lisinopril-hydrochlorothiazide. Absolute fail date is 02/08/2023.   Butch Penny, PharmD, P[place]y [place], CPP Clinical Pharmacist [facility] [phone]

## 2023-04-19 ENCOUNTER — Ambulatory Visit: Payer: Medicare PPO | Attending: Family Medicine | Admitting: Family Medicine

## 2023-04-19 ENCOUNTER — Encounter: Payer: Self-pay | Admitting: Family Medicine

## 2023-04-19 VITALS — BP 125/76 | HR 87 | Ht 66.0 in | Wt 141.0 lb

## 2023-04-19 DIAGNOSIS — M5441 Lumbago with sciatica, right side: Secondary | ICD-10-CM

## 2023-04-19 DIAGNOSIS — G8929 Other chronic pain: Secondary | ICD-10-CM | POA: Diagnosis not present

## 2023-04-19 DIAGNOSIS — I10 Essential (primary) hypertension: Secondary | ICD-10-CM | POA: Diagnosis not present

## 2023-04-19 DIAGNOSIS — Z23 Encounter for immunization: Secondary | ICD-10-CM

## 2023-04-19 MED ORDER — AMLODIPINE BESYLATE 5 MG PO TABS: 5.0000 mg | ORAL_TABLET | Freq: Every day | ORAL | 1 refills | Status: AC

## 2023-04-19 MED ORDER — LISINOPRIL-HYDROCHLOROTHIAZIDE 20-12.5 MG PO TABS: 2.0000 | ORAL_TABLET | Freq: Every day | ORAL | 1 refills | Status: DC

## 2023-04-19 NOTE — Patient Instructions (Signed)

## 2023-04-19 NOTE — Progress Notes (Signed)
Subjective:  Patient ID: Timothy [name], male    DOB: Dec 29, 1950  Age: 72 y.o. MRN: [id number]  CC: Medical Management of Chronic Issues   HPI Orlander UBAID COULIBALY is a 72 y.o. year old male with a history of Hypertension, chronic low back pain, history of Prostate ca, smokes cigars (since he was 38)   Interval History: Discussed the use of AI scribe software for clinical note transcription with the patient, who gave verbal consent to proceed.  He presents for a routine follow-up. He reports no new concerns and is doing well on his current medication regimen. His blood pressure is well controlled on amlodipine and lisinopril-hydrochlorothiazide.  The patient continues to experience chronic back pain, which is managed by a pain clinic. He reports that the pain sometimes radiates down his leg.  He remains on baclofen, gabapentin, Vicodin prescribed by the pain clinic.  The patient has a history of smoking since the age of 35. He currently smokes cigars and some cigarettes. He had a lung cancer screening last year, but was told he did not qualify for another one this year by the radiology department after I had ordered it.        Past Medical History:  Diagnosis Date   Chronic kidney disease (CKD), stage II (mild)    Dehydration    Essential hypertension    Low back pain with radiation    Personal history of prostate cancer     Past Surgical History:  Procedure Laterality Date   COLONOSCOPY     PROSTATE SURGERY      Family History  Problem Relation Age of Onset   Hypertension Mother    Cancer Neg Hx    Diabetes Neg Hx    CVA Neg Hx    Colon cancer Neg Hx    Colon polyps Neg Hx    Crohn's disease Neg Hx    Rectal cancer Neg Hx    Stomach cancer Neg Hx    Ulcerative colitis Neg Hx     Social History   Socioeconomic History   Marital status: Single    Spouse name: Not on file   Number of children: Not on file   Years of education: Not on file   Highest education  level: Not on file  Occupational History   Not on file  Tobacco Use   Smoking status: Some Days    Types: Cigars    Passive exposure: Never   Smokeless tobacco: Never  Vaping Use   Vaping status: Never Used  Substance and Sexual Activity   Alcohol use: Not Currently   Drug use: Yes    Types: Marijuana    Comment: LAST SMOKED 12/13/21,UPDATED 12/18/21   Sexual activity: Not Currently  Other Topics Concern   Not on file  Social History Narrative   Not on file   Social Drivers of Health   Financial Resource Strain: Low Risk  (07/24/2022)   Overall Financial Resource Strain (CARDIA)    Difficulty of Paying Living Expenses: Not hard at all  Food Insecurity: No Food Insecurity (07/24/2022)   Hunger Vital Sign    Worried About Running Out of Food in the Last Year: Never true    Ran Out of Food in the Last Year: Never true  Transportation Needs: No Transportation Needs (07/24/2022)   PRAPARE - Administrator, Civil Service (Medical): No    Lack of Transportation (Non-Medical): No  Physical Activity: Sufficiently Active (07/24/2022)   Exercise Vital  Sign    Days of Exercise per Week: 7 days    Minutes of Exercise per Session: 30 min  Stress: No Stress Concern Present (07/24/2022)   [employer] of Occupational Health - Occupational Stress Questionnaire    Feeling of Stress : Not at all  Social Connections: Socially Isolated (07/24/2022)   Social Connection and Isolation Panel [NHANES]    Frequency of Communication with Friends and Family: Twice a week    Frequency of Social Gatherings with Friends and Family: More than three times a week    Attends Religious Services: Never    Database administrator or Organizations: No    Attends Banker Meetings: Never    Marital Status: Divorced    No Known Allergies  Outpatient Medications Prior to Visit  Medication Sig Dispense Refill   baclofen (LIORESAL) 10 MG tablet Take 10 mg by mouth 3 (three) times daily.      gabapentin (NEURONTIN) 300 MG capsule Take 1 capsule (300 mg total) by mouth 3 (three) times daily as needed. 270 capsule 1   HYDROcodone-acetaminophen (NORCO/VICODIN) 5-325 MG tablet as needed.     amLODipine (NORVASC) 5 MG tablet Take 1 tablet (5 mg total) by mouth daily. 90 tablet 0   lisinopril-hydrochlorothiazide (ZESTORETIC) 20-12.5 MG tablet Take 2 tablets by mouth daily. 180 tablet 0   cyproheptadine (PERIACTIN) 4 MG tablet Take 1 tab PO TID PRN for appetite (Patient not taking: Reported on 04/19/2023) 30 tablet 0   DULoxetine (CYMBALTA) 60 MG capsule Take 1 capsule (60 mg total) by mouth daily. For back pain (Patient not taking: Reported on 04/19/2023) 90 capsule 1   meloxicam (MOBIC) 7.5 MG tablet Take 2 tablets (15 mg total) by mouth daily. (Patient not taking: Reported on 04/19/2023) 60 tablet 3   predniSONE (DELTASONE) 20 MG tablet Take 1 tablet (20 mg total) by mouth daily with breakfast. (Patient not taking: Reported on 04/19/2023) 5 tablet 0   No facility-administered medications prior to visit.     ROS Review of Systems  Constitutional:  Negative for activity change and appetite change.  HENT:  Negative for sinus pressure and sore throat.   Respiratory:  Negative for chest tightness, shortness of breath and wheezing.   Cardiovascular:  Negative for chest pain and palpitations.  Gastrointestinal:  Negative for abdominal distention, abdominal pain and constipation.  Genitourinary: Negative.   Musculoskeletal:  Positive for back pain.  Psychiatric/Behavioral:  Negative for behavioral problems and dysphoric mood.     Objective:  BP 125/76   Pulse 87   Ht 5\' 6"  (1.676 m)   Wt 141 lb (64 kg)   SpO2 99%   BMI 22.76 kg/m      04/19/2023    3:35 PM 07/15/2022   10:15 AM 06/11/2022    8:44 AM  BP/Weight  Systolic BP 125 109 162  Diastolic BP 76 72 91  Wt. (Lbs) 141 132.4 140.4  BMI 22.76 kg/m2 21.37 kg/m2 24.1 kg/m2      Physical Exam Constitutional:       Appearance: He is well-developed.  Cardiovascular:     Rate and Rhythm: Normal rate.     Heart sounds: Normal heart sounds. No murmur heard. Pulmonary:     Effort: Pulmonary effort is normal.     Breath sounds: Normal breath sounds. No wheezing or rales.  Chest:     Chest wall: No tenderness.  Abdominal:     General: Bowel sounds are normal. There is no distension.  Palpations: Abdomen is soft. There is no mass.     Tenderness: There is no abdominal tenderness.  Musculoskeletal:        General: Normal range of motion.     Right lower leg: No edema.     Left lower leg: No edema.  Neurological:     Mental Status: He is alert and oriented to person, place, and time.  Psychiatric:        Mood and Affect: Mood normal.        Latest Ref Rng & Units 06/11/2022    9:12 AM 04/21/2022   11:35 AM 12/09/2021   11:30 AM  CMP  Glucose 70 - 99 mg/dL 82  95  83   BUN 8 - 27 mg/dL 19  19  18    Creatinine 0.76 - 1.27 mg/dL 3.66  4.40  3.47   Sodium 134 - 144 mmol/L 140  138  138   Potassium 3.5 - 5.2 mmol/L 4.8  4.6  5.0   Chloride 96 - 106 mmol/L 102  102  99   CO2 20 - 29 mmol/L 22  28  20    Calcium 8.6 - 10.2 mg/dL 9.8  42.5  9.9   Total Protein 6.0 - 8.5 g/dL 7.3     Total Bilirubin 0.0 - 1.2 mg/dL 0.5     Alkaline Phos 44 - 121 IU/L 51     AST 0 - 40 IU/L 18     ALT 0 - 44 IU/L 11       Lipid Panel     Component Value Date/Time   CHOL 128 05/27/2021 0911   TRIG 97 05/27/2021 0911   HDL 39 (L) 05/27/2021 0911   CHOLHDL 3.1 07/22/2020 1532   CHOLHDL 5.0 Ratio 05/06/2010 2038   VLDL 37 05/06/2010 2038   LDLCALC 71 05/27/2021 0911    CBC    Component Value Date/Time   WBC 7.7 04/21/2022 1135   RBC 4.75 04/21/2022 1135   HGB 15.4 04/21/2022 1135   HGB 13.8 02/10/2019 1122   HCT 46.5 04/21/2022 1135   HCT 41.7 02/10/2019 1122   PLT 220 04/21/2022 1135   PLT 198 02/10/2019 1122   MCV 97.9 04/21/2022 1135   MCV 94 02/10/2019 1122   MCH 32.4 04/21/2022 1135   MCHC 33.1  04/21/2022 1135   RDW 15.5 04/21/2022 1135   RDW 13.0 02/10/2019 1122   LYMPHSABS 1.5 12/30/2020 1656   LYMPHSABS 2.8 02/10/2019 1122   MONOABS 0.7 12/30/2020 1656   EOSABS 0.3 12/30/2020 1656   EOSABS 0.5 (H) 02/10/2019 1122   BASOSABS 0.0 12/30/2020 1656   BASOSABS 0.0 02/10/2019 1122    No results found for: "HGBA1C"  Assessment & Plan:      Chronic Back Pain Pain managed by pain clinic. Pain radiates down the leg at times. -Continue current pain management plan with pain clinic.  Hypertension Well controlled on current medications. -Refill Amlodipine and Lisinopril-Hydrochlorothiazide prescriptions.  Tobacco Use Long-term smoker, currently smoking cigars and cigarettes. -Attempt to order lung cancer screening, despite previous denial due to cigar use.  General Health Maintenance -Administer influenza vaccine today. -Order routine blood work. -Schedule follow-up visit in six months.          Meds ordered this encounter  Medications   amLODipine (NORVASC) 5 MG tablet    Sig: Take 1 tablet (5 mg total) by mouth daily.    Dispense:  90 tablet    Refill:  1   lisinopril-hydrochlorothiazide (ZESTORETIC)  20-12.5 MG tablet    Sig: Take 2 tablets by mouth daily.    Dispense:  180 tablet    Refill:  1    Follow-up: Return in about 6 months (around 10/18/2023) for Chronic medical conditions.       Hoy Register, MD, FAAFP. [facility] [place], [place] [phone]   04/19/2023, 5:34 PM

## 2023-04-20 LAB — CMP14+EGFR
ALT: 11 [IU]/L (ref 0–44)
AST: 19 [IU]/L (ref 0–40)
Albumin: 4.4 g/dL (ref 3.8–4.8)
Alkaline Phosphatase: 48 [IU]/L (ref 44–121)
BUN/Creatinine Ratio: 14 (ref 10–24)
BUN: 22 mg/dL (ref 8–27)
Bilirubin Total: 0.3 mg/dL (ref 0.0–1.2)
CO2: 26 mmol/L (ref 20–29)
Calcium: 9.7 mg/dL (ref 8.6–10.2)
Chloride: 101 mmol/L (ref 96–106)
Creatinine, Ser: 1.56 mg/dL — ABNORMAL HIGH (ref 0.76–1.27)
Globulin, Total: 2.9 g/dL (ref 1.5–4.5)
Glucose: 93 mg/dL (ref 70–99)
Potassium: 4.1 mmol/L (ref 3.5–5.2)
Sodium: 143 mmol/L (ref 134–144)
Total Protein: 7.3 g/dL (ref 6.0–8.5)
eGFR: 47 mL/min/{1.73_m2} — ABNORMAL LOW (ref >59)

## 2023-10-12 ENCOUNTER — Ambulatory Visit

## 2023-10-18 ENCOUNTER — Ambulatory Visit: Payer: Medicare PPO | Admitting: Family Medicine

## 2023-11-07 ENCOUNTER — Other Ambulatory Visit: Payer: Self-pay | Admitting: Family Medicine

## 2023-11-07 DIAGNOSIS — I10 Essential (primary) hypertension: Secondary | ICD-10-CM

## 2023-11-24 ENCOUNTER — Telehealth: Payer: Self-pay | Admitting: Family Medicine

## 2023-11-24 NOTE — Telephone Encounter (Signed)
 Pt unconfirmed appt per volunteer

## 2023-11-25 ENCOUNTER — Ambulatory Visit: Admitting: Family Medicine

## 2024-02-15 ENCOUNTER — Ambulatory Visit: Attending: Family Medicine

## 2024-02-15 NOTE — Progress Notes (Signed)
 This encounter was created in error - please disregard.

## 2024-02-15 NOTE — Progress Notes (Signed)
 02/15/2024: Patient stated that he relocated to [facility] [place] and no longer a patient of Dr. Newlin.  Sakeenah Valcarcel N. Tomie, LPN [employer]
